# Patient Record
Sex: Female | Born: 1990 | Hispanic: Yes | Marital: Single | Smoking: Never smoker
Health system: Southern US, Community
[De-identification: ages and names within clinical notes are randomized; demographics above are authoritative.]

## PROBLEM LIST (undated history)

## (undated) DIAGNOSIS — R55 Syncope and collapse: Secondary | ICD-10-CM

## (undated) DIAGNOSIS — R000 Tachycardia, unspecified: Secondary | ICD-10-CM

## (undated) DIAGNOSIS — L508 Other urticaria: Secondary | ICD-10-CM

## (undated) DIAGNOSIS — F909 Attention-deficit hyperactivity disorder, unspecified type: Secondary | ICD-10-CM

## (undated) DIAGNOSIS — G90A Postural orthostatic tachycardia syndrome (POTS): Secondary | ICD-10-CM

## (undated) DIAGNOSIS — G932 Benign intracranial hypertension: Secondary | ICD-10-CM

## (undated) DIAGNOSIS — H532 Diplopia: Secondary | ICD-10-CM

## (undated) DIAGNOSIS — G43909 Migraine, unspecified, not intractable, without status migrainosus: Secondary | ICD-10-CM

## (undated) DIAGNOSIS — H4710 Unspecified papilledema: Principal | ICD-10-CM

## (undated) DIAGNOSIS — I498 Other specified cardiac arrhythmias: Secondary | ICD-10-CM

## (undated) DIAGNOSIS — R079 Chest pain, unspecified: Secondary | ICD-10-CM

## (undated) DIAGNOSIS — I951 Orthostatic hypotension: Secondary | ICD-10-CM

## (undated) DIAGNOSIS — F419 Anxiety disorder, unspecified: Secondary | ICD-10-CM

## (undated) HISTORY — DX: Migraine, unspecified, not intractable, without status migrainosus: G43.909

## (undated) HISTORY — DX: Chest pain, unspecified: R07.9

## (undated) HISTORY — DX: Syncope and collapse: R55

## (undated) HISTORY — DX: Anxiety disorder, unspecified: F41.9

## (undated) HISTORY — DX: Benign intracranial hypertension: G93.2

## (undated) HISTORY — DX: Attention-deficit hyperactivity disorder, unspecified type: F90.9

## (undated) HISTORY — PX: WISDOM TOOTH EXTRACTION: SHX21

## (undated) HISTORY — DX: Diplopia: H53.2

## (undated) HISTORY — PX: OTHER SURGICAL HISTORY: SHX169

## (undated) HISTORY — DX: Other urticaria: L50.8

## (undated) HISTORY — DX: Unspecified papilledema: H47.10

---
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
## 2011-03-29 NOTE — Op Note (Signed)
NAMEMADELLINE, ESHBACH               ACCOUNT NO.:  [id number]   MEDICAL RECORD NO.:  [id number]          PATIENT TYPE:  AMB   LOCATION:  SDS                          FACILITY:  [facility]   PHYSICIAN:  Jon Gills, M.D.  DATE OF BIRTH:  10-20-1991   DATE OF PROCEDURE:  10/05/2007  DATE OF DISCHARGE:  10/05/2007                               OPERATIVE REPORT   PREOPERATIVE DIAGNOSIS:  Epigastric abdominal pain.   POSTOPERATIVE DIAGNOSIS:  Epigastric abdominal pain.   OPERATION:  Upper GI endoscopy with biopsy.   SURGEON:  Jon Gills, M.D., pediatric gastroenterologist.   ASSISTANT:  None.   DESCRIPTION OF FINDINGS:  Following informed written consent, the  patient was taken into the operating room and placed under general  anesthesia with continuous cardiopulmonary monitoring.  She remained in  the supine position and the Pentax upper GI endoscope was passed by  mouth and advanced without difficulty.  A competent lower esophageal  sphincter was identified 38 cm from the incisors.  There was no visual  evidence for esophagitis, duodenitis or peptic ulcer disease.  Moderate  erythema and nodularity were seen in the stomach but there was no actual  ulceration.  A solitary gastric biopsy was negative for Helicobacter by  CLO testing.  Multiple biopsies were obtained in the esophagus, stomach  and duodenum and were found to be histologically normal.  The endoscope  was gradually withdrawn, and the patient was awakened and taken to the  recovery room in satisfactory condition.  She will be released later  today to the care of her family.   DESCRIPTION OF TECHNICAL PROCEDURES USED:  Pentax upper GI endoscope  with cold biopsy forceps.   DESCRIPTION OF SPECIMENS REMOVED:  Esophagus times three in formalin.  Gastric times one for CLO testing.  Gastric times three in formalin.  Duodenum times three in formalin.           ______________________________  Jon Gills,  M.D.     JHC/MEDQ  D:  11/13/2007  T:  11/14/2007  Job:  [id number]   cc:   Emeterio Reeve, MD

## 2011-08-23 LAB — DIFFERENTIAL
Eosinophils Absolute: 0.3
Eosinophils Relative: 3
Lymphocytes Relative: 50
Lymphs Abs: 3.9
Monocytes Relative: 4

## 2011-08-23 LAB — CBC
HCT: 39.1
MCV: 89.8
Platelets: 317
RBC: 4.35
WBC: 7.7

## 2011-08-23 LAB — CLOTEST (H. PYLORI), BIOPSY

## 2012-08-29 ENCOUNTER — Encounter: Payer: Self-pay | Admitting: *Deleted

## 2012-08-29 ENCOUNTER — Ambulatory Visit (INDEPENDENT_AMBULATORY_CARE_PROVIDER_SITE_OTHER): Payer: Self-pay | Admitting: Internal Medicine

## 2012-08-29 ENCOUNTER — Encounter: Payer: Self-pay | Admitting: Internal Medicine

## 2012-08-29 VITALS — BP 109/74 | HR 75 | Ht 63.0 in | Wt 148.2 lb

## 2012-08-29 DIAGNOSIS — R55 Syncope and collapse: Secondary | ICD-10-CM

## 2012-08-29 DIAGNOSIS — R079 Chest pain, unspecified: Secondary | ICD-10-CM

## 2012-08-29 HISTORY — DX: Chest pain, unspecified: R07.9

## 2012-08-29 HISTORY — DX: Syncope and collapse: R55

## 2012-08-29 MED ORDER — PROPRANOLOL HCL ER 60 MG PO CP24: 60.0000 mg | ORAL_CAPSULE | Freq: Every day | ORAL | Status: DC

## 2012-08-29 MED ORDER — METOPROLOL TARTRATE 25 MG PO TABS: 25.0000 mg | ORAL_TABLET | Freq: Two times a day (BID) | ORAL | Status: DC

## 2012-08-29 MED ORDER — ATENOLOL 25 MG PO TABS: 25.0000 mg | ORAL_TABLET | Freq: Every day | ORAL | Status: DC

## 2012-08-29 NOTE — Assessment & Plan Note (Signed)
Lightheadedness and chest discomfort with exertion is unusual. We'll undertake an echo to look for the origins of her coronary ostia; if these are not clear we will need more advanced imaging.  I also gave her website information for [facility] place and NDRF

## 2012-08-29 NOTE — Progress Notes (Signed)
ELECTROPHYSIOLOGY CONSULT NOTE  Patient ID: MISTIE ADNEY, MRN: [id number], DOB/AGE: June 21, 1991 21 y.o. Admit date: (Not on file) Date of Consult: 08/29/2012  Primary Physician:2\ No primary provider on file. Primary Cardiologist: Sharlette Dense  Chief Complaint: syncope   HPI KHADIJAH MASTRIANNI is a 21 y.o. female seen at the request of Dr. Paulino Rily because of recurrent syncope.  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 Who is  A triple major.  She has about a 15 month history of syncope although she has a lifelong history of exercise associated lightheadedness particularly notable in recovery. She's had problems with chest discomfort described as a pressure also with exercise.  About a year ago she had her first syncopal episode. This occurred in the context of taking a muscle relaxant to which the syncope was described. That medication was stopped. She's continued to have episodes. There is associated with a stereo typical prodrome of nausea lightheadedness and mild diaphoresis. She does get some eye twitching of her leg. The recovery phase is notable for nausea profound diaphoresis some for power profound fatigue and residual orthostatic intolerance.  She has a shower intolerance heat intolerance Jacuzzi intolerance. Her diet is fluid deplete of salt deplete. She does not notice particularly symptoms being aggravated around the time of her menses.  She had also described variability in blood pressure and heart rate although the numbers that are noted in the record include heart rates up to the low 100 range and blood pressures to the 120 range. She was started on Bystolic 2.5 mg a day and this has been associated with significant improvement.  She also has significant exercise intolerance associated with tachypalpitations. This has been also ameliorated by the aforementioned therapy.  She denies significant psychosocial stress. She does have ADD but she is not on any stimulants at the  current time.   Past Medical History  Diagnosis Date  . ADHD (attention deficit hyperactivity disorder)   . Migraines   . Recurrent urticaria     normal CBC,TSH -?  . Anxiety     induced  . Syncope       Surgical History: No past surgical history on file.   Home Meds: Prior to Admission medications   Medication Sig Start Date End Date Taking? Authorizing Provider  drospirenone-ethinyl estradiol (YAZ,GIANVI,LORYNA) 3-0.02 MG tablet Take 1 tablet by mouth daily.   Yes Historical Provider, MD  nebivolol (BYSTOLIC) 5 MG tablet Take 2.5 mg by mouth daily.   Yes Historical Provider, MD  atenolol (TENORMIN) 25 MG tablet Take 1 tablet (25 mg total) by mouth daily. 08/29/12   Duke Salvia, MD  metoprolol tartrate (LOPRESSOR) 25 MG tablet Take 1 tablet (25 mg total) by mouth 2 (two) times daily. 08/29/12   Duke Salvia, MD  propranolol ER (INDERAL LA) 60 MG 24 hr capsule Take 1 capsule (60 mg total) by mouth daily. 08/29/12   Duke Salvia, MD      Allergies:  Allergies  Allergen Reactions  . Lo Loestrin Fe (Norethin-Eth Estrad-Fe Biphas)     Extreme moodiness  . Mold Extract (Trichophyton)     MOLD -DR Hawley Callas- pt has an allergy to  cockroaches  . Penciclovir     Penicillin causes rash  . Penicillins   . Pollen Extract     Tree pollen/  . Tamiflu (Oseltamivir Phosphate)     Rash / itching 2011  . Vyvanse (Lisdexamfetamine Dimesylate)     anxiety    History  Social History  . Marital Status: Single    Spouse Name: N/A    Number of Children: N/A  . Years of Education: N/A   Occupational History  . Not on file.   Social History Main Topics  . Smoking status: Never Smoker   . Smokeless tobacco: Not on file  . Alcohol Use: Yes     Occassionally  . Drug Use: No  . Sexually Active: Not on file   Other Topics Concern  . Not on file   Social History Narrative   Pt does not smoke.                                                                                                                                       Exercise : Yes walking campus daily at school                                                                                            Education: [employer]     No family history on file.   ROS:  Please see the history of present illness.     All other systems reviewed and negative.    Physical Exam:  Blood pressure 109/74, pulse 75, height 5\' 3"  (1.6 m), weight 148 lb 3.2 oz (67.223 kg). General: Well developed, well nourished female in no acute distress. Head: Normocephalic, atraumatic, sclera non-icteric, no xanthomas, nares are without discharge. Lymph Nodes:  none Back: without scoliosis/kyphosis, no CVA tendersness Neck: Negative for carotid bruits. JVD not elevated. Lungs: Clear bilaterally to auscultation without wheezes, rales, or rhonchi. Breathing is unlabored. Heart: RRR with S1 S2. No  murmur , rubs, or gallops appreciated. Abdomen: Soft, non-tender, non-distended with normoactive bowel sounds. No hepatomegaly. No rebound/guarding. No obvious abdominal masses. Msk:  Strength and tone appear normal for age. Extremities: No clubbing or cyanosis. No  edema.  Distal pedal pulses are 2+ and equal bilaterally. Skin: Warm and Dry Neuro: Alert and oriented X 3. CN III-XII intact Grossly normal sensory and motor function . Psych:  Responds to questions appropriately with a normal affect.      Labs: Cardiac Enzymes No results found for this basename: CKTOTAL:4,CKMB:4,TROPONINI:4 in the last 72 hours CBC Lab Results  Component Value Date   WBC 7.7 10/04/2007   HGB 13.3 10/04/2007   HCT 39.1 10/04/2007   MCV 89.8 10/04/2007   PLT 317 10/04/2007     EKG:  Sinus rhythm at 67 Intervals 13/08/39  Axis LI No evidence of ventricular preexcitation  Assessment and Plan:  Sherryl Manges  The sotalol and a

## 2012-08-29 NOTE — Patient Instructions (Signed)
Your physician wants you to follow-up in: 4 months with Dr. Graciela Husbands.  You will receive a reminder letter in the mail two months in advance. If you don't receive a letter, please call our office to schedule the follow-up appointment.  Your physician has requested that you have an echocardiogram. Echocardiography is a painless test that uses sound waves to create images of your heart. It provides your doctor with information about the size and shape of your heart and how well your heart's chambers and valves are working. This procedure takes approximately one hour. There are no restrictions for this procedure.  Try the following meds and let us know which one you want to take:  Inderal 60mg  daily, Metoprolol Tar. 25mg  BID, Atenolol 25mg  daily.

## 2012-08-29 NOTE — Assessment & Plan Note (Signed)
The patient has symptoms with epiphenomena consistent with a diagnosis of neurally mediated syncope. We spent 50 minutes discussing the physiology and strategies to try to ameliorate symptoms. Specifically we discussed the role of heat avoidance, and salt and water repletion, continued use of beta blockers. We also gave her a handicap pass because of the prolonged distances she has to walk for school.

## 2012-09-06 ENCOUNTER — Ambulatory Visit (HOSPITAL_COMMUNITY): Payer: BC Managed Care – PPO | Attending: Cardiovascular Disease

## 2012-09-06 DIAGNOSIS — I369 Nonrheumatic tricuspid valve disorder, unspecified: Secondary | ICD-10-CM | POA: Insufficient documentation

## 2012-09-06 DIAGNOSIS — I059 Rheumatic mitral valve disease, unspecified: Secondary | ICD-10-CM | POA: Insufficient documentation

## 2012-09-06 DIAGNOSIS — R55 Syncope and collapse: Secondary | ICD-10-CM | POA: Insufficient documentation

## 2012-09-06 NOTE — Progress Notes (Signed)
Echocardiogram performed.  

## 2012-09-12 ENCOUNTER — Telehealth: Payer: Self-pay | Admitting: Internal Medicine

## 2012-09-12 NOTE — Telephone Encounter (Signed)
Or work [phone] , mother calling for echo results

## 2012-09-13 NOTE — Telephone Encounter (Signed)
Gave mother normal results of echo. Please advise of further testing, pt still having dizzy spells with numbness to finger tips and has to lay down. Pt/ mother wants to do further testing.

## 2012-09-13 NOTE — Telephone Encounter (Signed)
F/u   Pt mother calling for f/u on echo results, [phone] wk or [phone] cell

## 2012-10-01 NOTE — Telephone Encounter (Signed)
G  Could you arrange for earlier followup thnaks steve

## 2012-10-02 NOTE — Telephone Encounter (Signed)
Will forward to Dr Odessa Fleming nurse to arrange.

## 2012-10-03 ENCOUNTER — Telehealth: Payer: Self-pay | Admitting: Internal Medicine

## 2012-10-03 NOTE — Telephone Encounter (Signed)
appt moved up.

## 2012-10-03 NOTE — Telephone Encounter (Addendum)
Pt's mother calling upset that it took 6 weeks to get pt in for consult, then had echo and was told would get results in two days, and it took a week, now she says she's been waiting three weeks to get an mri, gesila schedules them and does not have an order, I don't see mention of it in her chart, pt going back to school 10-23-12 and if needs further testing or appts needs done asap, pls call pt's mother

## 2012-10-15 ENCOUNTER — Ambulatory Visit (INDEPENDENT_AMBULATORY_CARE_PROVIDER_SITE_OTHER): Payer: BC Managed Care – PPO | Admitting: Internal Medicine

## 2012-10-15 VITALS — BP 98/60 | HR 57 | Ht 63.0 in | Wt 151.0 lb

## 2012-10-15 DIAGNOSIS — R55 Syncope and collapse: Secondary | ICD-10-CM

## 2012-10-15 DIAGNOSIS — R079 Chest pain, unspecified: Secondary | ICD-10-CM

## 2012-10-15 NOTE — Assessment & Plan Note (Signed)
Coronary ostia are normal. Dr. Jens Som raises the issue as to whether one can infer from the takeoff that the LAD in fact arises from the left ostium. I know in some cases the proximal course of the LAD can be identified and that would suffice.

## 2012-10-15 NOTE — Progress Notes (Signed)
.  kf Patient has no care team.   HPI  Alexandra Schwartz is a 21 y.o. 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  She has had no recurrent syncope. She has had some episodes of profound lightheadedness associated with low blood pressure in the context of severe headaches thought maybe to be migraines.  She is a little bit better on the propranolol compared to the Bystolic. She is not try the other beta blockers as of yet.  Past Medical History  Diagnosis Date  . ADHD (attention deficit hyperactivity disorder)   . Migraines   . Recurrent urticaria     normal CBC,TSH -?  . Anxiety     induced  . Syncope   . Chest pain on exertion 08/29/2012  . 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  Current Outpatient Prescriptions  Medication Sig Dispense Refill  . drospirenone-ethinyl estradiol (YAZ,GIANVI,LORYNA) 3-0.02 MG tablet Take 1 tablet by mouth daily.      . propranolol ER (INDERAL LA) 60 MG 24 hr capsule Take 1 capsule (60 mg total) by mouth daily.  30 capsule  0    Allergies  Allergen Reactions  . Lo Loestrin Fe (Norethin-Eth Estrad-Fe Biphas)     Extreme moodiness  . Mold Extract (Trichophyton)     MOLD -DR [place] Callas- pt has an allergy to  cockroaches  . Penciclovir     Penicillin causes rash  . Penicillins   . Pollen Extract     Tree pollen/  . Tamiflu (Oseltamivir Phosphate)     Rash / itching 2011  . Vyvanse (Lisdexamfetamine Dimesylate)     anxiety    Review of Systems negative except from HPI and PMH  Physical Exam BP 98/60  Pulse 57  Ht 5\' 3"  (1.6 m)  Wt 151 lb (68.493 kg)  BMI 26.75 kg/m2 Well developed and well nourished in no acute distress HENT normal E scleral and icterus clear Neck Supple JVP flat; carotids brisk and full Clear to ausculation  Regular rate and rhythm, no murmurs gallops or rub Soft with active bowel sounds No clubbing cyanosis none Edema Alert and oriented, grossly normal motor  and sensory function Skin Warm and Dry    Assessment and  Plan

## 2012-10-15 NOTE — Patient Instructions (Addendum)
Your physician recommends that you schedule a follow-up appointment in: 8-10 weeks with Dr. Graciela Husbands.

## 2012-10-15 NOTE — Assessment & Plan Note (Signed)
We will continue her on her beta blocker therapy. We have discussed the potential role of Florinef as her blood pressures have been relatively low. The family however reminded me that there've been episodes where her blood pressure has been elevated also. She has increased her salt and water intake. She is going to [place] to see her father for 2 and half weeks. She is advised regarding heat and airplane travel

## 2012-10-31 ENCOUNTER — Telehealth: Payer: Self-pay | Admitting: Internal Medicine

## 2012-10-31 NOTE — Telephone Encounter (Signed)
New problem:    Mother calling very upset regarding her daughter Alexandra Schwartz results. Was told that Dr. Graciela Husbands had ask for repeat reading of test. Would like a call today.

## 2012-10-31 NOTE — Telephone Encounter (Signed)
Mother wants updated results of echo. Will forward to Dr. Graciela Husbands.

## 2012-11-02 NOTE — Telephone Encounter (Signed)
My apologies   coronrary ostia were visualized and were normal thanks

## 2012-11-16 NOTE — Telephone Encounter (Signed)
Left message with normal results

## 2012-11-28 ENCOUNTER — Encounter: Payer: Self-pay | Admitting: Obstetrics and Gynecology

## 2012-11-28 ENCOUNTER — Ambulatory Visit: Payer: BC Managed Care – PPO | Admitting: Obstetrics and Gynecology

## 2012-11-28 VITALS — BP 100/64 | Temp 98.6°F | Wt 154.0 lb

## 2012-11-28 DIAGNOSIS — G43909 Migraine, unspecified, not intractable, without status migrainosus: Secondary | ICD-10-CM | POA: Insufficient documentation

## 2012-11-28 DIAGNOSIS — IMO0001 Reserved for inherently not codable concepts without codable children: Secondary | ICD-10-CM

## 2012-11-28 DIAGNOSIS — F419 Anxiety disorder, unspecified: Secondary | ICD-10-CM

## 2012-11-28 DIAGNOSIS — R102 Pelvic and perineal pain: Secondary | ICD-10-CM

## 2012-11-28 DIAGNOSIS — F909 Attention-deficit hyperactivity disorder, unspecified type: Secondary | ICD-10-CM

## 2012-11-28 LAB — POCT URINALYSIS DIPSTICK
Bilirubin, UA: NEGATIVE
Glucose, UA: NEGATIVE
Ketones, UA: NEGATIVE
Nitrite, UA: NEGATIVE

## 2012-11-28 NOTE — Progress Notes (Signed)
21 YO complains of abdominal/pelvic pain for the past several months. Occurs almost each day off and on, sharp/achy and includes back. No known initiating or alleviating factors.  Has problems starting her urine (doesn't have the sensation or may have to go more frequently) denies urgency, incontinence  Denies changes in bowel movements, dyspareunia or vaginitis symptoms.  States that Ibuprofen 600 mg will help when taken.  O: Abdomen: soft, right lower quadrant tenderness without guarding      Pelvic: EGBUS-wnl, vagina-normal, cervix-no lesions, uterus-normal size but with tenderness at fundus     adnexae-no tenderness or masses   U/A: SG-1.005, pH-6.0, leuk-trace otherwise negative UPT-Negative  A: Pelvic Pain  P: Continue NSAIDs with food as directed-as needed      Pelvic U/S to rule out pelvic masses      Reviewed causes of pelvic pain: urogenital, previous surgery, gastrointestinal and musculoskeletal.      RTO-as scheduled or prn  Jabar Krysiak, PA-C

## 2012-11-28 NOTE — Progress Notes (Signed)
Contraception: OCP (estrogen/progesterone) History of STD:  no history of PID, STD's History of ovarian cyst: no History of fibroids: no History of endometriosis:no Previous ultrasound:no  Urinary symptoms: urinary frequency and urinary hesitancy Gastro-intestinal symptoms:  Constipation: yes     Diarrhea: no     Nausea: no     Vomiting: no     Fever: no Vaginal discharge: no vaginal discharge

## 2012-12-13 ENCOUNTER — Ambulatory Visit: Payer: BC Managed Care – PPO

## 2012-12-13 ENCOUNTER — Ambulatory Visit: Payer: BC Managed Care – PPO | Admitting: Obstetrics and Gynecology

## 2012-12-13 ENCOUNTER — Encounter: Payer: Self-pay | Admitting: Obstetrics and Gynecology

## 2012-12-13 VITALS — BP 90/70 | Temp 99.0°F | Wt 152.0 lb

## 2012-12-13 DIAGNOSIS — R102 Pelvic and perineal pain: Secondary | ICD-10-CM

## 2012-12-13 MED ORDER — LEVONORGESTREL-ETHINYL ESTRAD 0.15-30 MG-MCG PO TABS: 1.0000 | ORAL_TABLET | Freq: Every day | ORAL | Status: DC

## 2012-12-13 NOTE — Progress Notes (Signed)
21 YO seen January 15th for pelvic pain of several month duration returns for pelvic ultrasound.  Patient has noticed since last visit that her pain is more on the right, otherwise no pattern is associated with it.  O: U/S: uterus-5.97 x 3.63 x 2.98 cm with 0.44 endometrium; right ovary-2.77 x 1.47 x 1.58 cm and left ovary-3.29 x 1.96 x 1.52 cm   A: Pelvic Pain  R>L   P: Reviewed causes of pelvic pain: urogenital, previous surgery, gastrointestinal and musculoskeletal with patient and her     mother who accompanies her.  Advised to follow up with Dr. Paulino Rily for non-invasive evaluation of pelvic pain      Advised that laparoscopy to determine endometriosis or scar tissue may be a last resort evaluation if nothing is found otherwise.     Observation is always an option      Patient and her mother want to try another BCP as Lo Loestrin seemed to help with cramps better than Beyaz but it made her     emotional       Levora 1.5/30  #1 1 po qd with 11 refills to begin with next cycle  DAW-brand medically necessary due to patient's reaction to      norethindrone       RTO-1 year or prn  Tomika Eckles, PA-C

## 2013-01-02 ENCOUNTER — Ambulatory Visit: Payer: Self-pay | Admitting: Internal Medicine

## 2013-01-22 ENCOUNTER — Ambulatory Visit: Payer: Self-pay | Admitting: Internal Medicine

## 2013-04-04 ENCOUNTER — Other Ambulatory Visit: Payer: BC Managed Care – PPO

## 2013-04-04 ENCOUNTER — Encounter: Payer: Self-pay | Admitting: Internal Medicine

## 2013-04-04 ENCOUNTER — Encounter: Payer: Self-pay | Admitting: *Deleted

## 2013-04-04 ENCOUNTER — Ambulatory Visit (INDEPENDENT_AMBULATORY_CARE_PROVIDER_SITE_OTHER): Payer: BC Managed Care – PPO | Admitting: Internal Medicine

## 2013-04-04 VITALS — BP 102/76 | HR 92 | Ht 63.0 in | Wt 149.8 lb

## 2013-04-04 DIAGNOSIS — F419 Anxiety disorder, unspecified: Secondary | ICD-10-CM

## 2013-04-04 DIAGNOSIS — F411 Generalized anxiety disorder: Secondary | ICD-10-CM

## 2013-04-04 DIAGNOSIS — R55 Syncope and collapse: Secondary | ICD-10-CM

## 2013-04-04 NOTE — Progress Notes (Signed)
Patient Care Team: Sharon A Wolters, MD as PCP - General (Family Medicine)   HPI  Alexandra Schwartz is a 22 y.o. 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 She does have complaints of lightheadedness with climbing stairs.  She's been treated with beta blockers. There've been some issues with hypertension and so Florinef avoided.  Echo 10/13 demonstrated normal left ventricular function   she is having problems with exercise. She is noted post exercise lightheadedness which is quite significant. She is also, interestingly, no dizziness with climbing and descending stairs. Her volume status is quite replete.  Her mother has noted that when she is symptomatic her blood pressures have been running lower. She is doing salt supplementation via diet   She expresses anxiety about the fact that she is not sure that her illnesses not all psychosomatic  Past Medical History  Diagnosis Date  . ADHD (attention deficit hyperactivity disorder)   . Migraines   . Recurrent urticaria     normal CBC,TSH -?  . Anxiety     induced  . Syncope   . Chest pain on exertion 08/29/2012  . 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 Wisdom    . Wisdom tooth extraction      Current Outpatient Prescriptions  Medication Sig Dispense Refill  . levonorgestrel-ethinyl estradiol (NORDETTE) 0.15-30 MG-MCG tablet Take 1 tablet by mouth daily.  1 Package  11   No current facility-administered medications for this visit.    Allergies  Allergen Reactions  . Lo Loestrin Fe (Norethin-Eth Estrad-Fe Biphas)     Extreme moodiness  . Mold Extract (Trichophyton)     MOLD -DR Sharma- pt has an allergy to  cockroaches  . Penciclovir     Penicillin causes rash  . Penicillins   . Pollen Extract     Tree pollen/  . Tamiflu (Oseltamivir Phosphate)     Rash / itching 2011  . Vyvanse (Lisdexamfetamine Dimesylate)     anxiety    Review of Systems  negative except from HPI and PMH  Physical Exam BP 102/76  Pulse 92  Ht 5' 3" (1.6 m)  Wt 149 lb 12.8 oz (67.949 kg)  BMI 26.54 kg/m2  SpO2 99% Well developed and nourished in no acute distress HENT normal Neck supple with JVP-flat Clear Regular rate and rhythm, no murmurs or gallops Abd-soft with active BS No Clubbing cyanosis edema Skin-warm and dry A & Oriented  Grossly normal sensory and motor function    Assessment and  Plan  

## 2013-04-04 NOTE — Patient Instructions (Signed)
Your physician recommends that you have lab work: 24 hour urine for sodium  Your physician has recommended that you have a tilt table test. This test is sometimes used to help determine the cause of fainting spells. You lie on a table that moves from a lying down to an upright position. The change in position can bring on loss of consciousness. The doctor monitors your symptoms, heart rate, EKG, and blood pressure throughout the test. The doctor also may give you a medicine and then monitor your response to the medicine. This is done in the hospital and usually takes half of a day to complete the procedure. Please see the instruction sheet given to you today for more information.

## 2013-04-04 NOTE — Assessment & Plan Note (Signed)
She continues to have symptoms consistent with dysautonomia with postexercise lightheadedness. Her lightheadedness with climbing stairs is little bit harder for me to understand. Diet fluid replete sounds; we will measure urine sodium excretion. Her blood pressures have been lower since in the event that sodium status is replete we will  Anticipate using ProAmatine.  She would like to pursue tilt table testing for clarification of mechanism. We will arrange that

## 2013-04-15 ENCOUNTER — Telehealth: Payer: Self-pay | Admitting: *Deleted

## 2013-04-15 ENCOUNTER — Encounter: Payer: Self-pay | Admitting: *Deleted

## 2013-04-15 DIAGNOSIS — R55 Syncope and collapse: Secondary | ICD-10-CM

## 2013-04-15 NOTE — Telephone Encounter (Signed)
I called the patient today on her cell # [phone] to schedule her Tilt Table test to be done. This is scheduled for 05/03/13 at 1:00 pm. Written instructions mailed to the patient. I have left a message on her cell # confirming the date and time and that she will need labs on 04/26/13.

## 2013-04-22 ENCOUNTER — Other Ambulatory Visit: Payer: Self-pay | Admitting: Internal Medicine

## 2013-04-25 ENCOUNTER — Other Ambulatory Visit (INDEPENDENT_AMBULATORY_CARE_PROVIDER_SITE_OTHER): Payer: BC Managed Care – PPO

## 2013-04-25 DIAGNOSIS — R55 Syncope and collapse: Secondary | ICD-10-CM

## 2013-04-25 LAB — CBC WITH DIFFERENTIAL/PLATELET
Basophils Relative: 0.5 % (ref 0.0–3.0)
Eosinophils Relative: 1.4 % (ref 0.0–5.0)
HCT: 39.2 % (ref 36.0–46.0)
Hemoglobin: 12.9 g/dL (ref 12.0–15.0)
Lymphs Abs: 3.3 10*3/uL (ref 0.7–4.0)
MCV: 91.6 fl (ref 78.0–100.0)
Monocytes Absolute: 0.3 10*3/uL (ref 0.1–1.0)
Monocytes Relative: 3.6 % (ref 3.0–12.0)
Neutro Abs: 4.4 10*3/uL (ref 1.4–7.7)
RBC: 4.28 Mil/uL (ref 3.87–5.11)
WBC: 8.2 10*3/uL (ref 4.5–10.5)

## 2013-04-25 LAB — BASIC METABOLIC PANEL
Chloride: 108 mEq/L (ref 96–112)
GFR: 128.77 mL/min (ref >60.00)
Potassium: 3.6 mEq/L (ref 3.5–5.1)
Sodium: 139 mEq/L (ref 135–145)

## 2013-04-25 LAB — PROTIME-INR: Prothrombin Time: 10.3 s (ref 10.2–12.4)

## 2013-04-26 ENCOUNTER — Other Ambulatory Visit: Payer: BC Managed Care – PPO

## 2013-04-29 ENCOUNTER — Encounter (HOSPITAL_COMMUNITY): Payer: Self-pay | Admitting: Pharmacy Technician

## 2013-04-30 ENCOUNTER — Other Ambulatory Visit: Payer: Self-pay

## 2013-04-30 ENCOUNTER — Encounter (HOSPITAL_COMMUNITY): Payer: Self-pay | Admitting: Emergency Medicine

## 2013-04-30 ENCOUNTER — Emergency Department (HOSPITAL_COMMUNITY)
Admission: EM | Admit: 2013-04-30 | Discharge: 2013-05-01 | Disposition: A | Discharge status: Home / Self Care | Payer: BC Managed Care – PPO | Attending: Emergency Medicine | Admitting: Emergency Medicine

## 2013-04-30 DIAGNOSIS — Z8679 Personal history of other diseases of the circulatory system: Secondary | ICD-10-CM | POA: Insufficient documentation

## 2013-04-30 DIAGNOSIS — R0789 Other chest pain: Secondary | ICD-10-CM | POA: Insufficient documentation

## 2013-04-30 DIAGNOSIS — E876 Hypokalemia: Secondary | ICD-10-CM

## 2013-04-30 DIAGNOSIS — Z880 Allergy status to penicillin: Secondary | ICD-10-CM | POA: Insufficient documentation

## 2013-04-30 DIAGNOSIS — F411 Generalized anxiety disorder: Secondary | ICD-10-CM | POA: Insufficient documentation

## 2013-04-30 DIAGNOSIS — Z79899 Other long term (current) drug therapy: Secondary | ICD-10-CM | POA: Insufficient documentation

## 2013-04-30 DIAGNOSIS — Z872 Personal history of diseases of the skin and subcutaneous tissue: Secondary | ICD-10-CM | POA: Insufficient documentation

## 2013-04-30 DIAGNOSIS — R110 Nausea: Secondary | ICD-10-CM | POA: Insufficient documentation

## 2013-04-30 DIAGNOSIS — Z8669 Personal history of other diseases of the nervous system and sense organs: Secondary | ICD-10-CM | POA: Insufficient documentation

## 2013-04-30 DIAGNOSIS — R42 Dizziness and giddiness: Secondary | ICD-10-CM | POA: Insufficient documentation

## 2013-04-30 DIAGNOSIS — I498 Other specified cardiac arrhythmias: Secondary | ICD-10-CM | POA: Insufficient documentation

## 2013-04-30 DIAGNOSIS — Z3202 Encounter for pregnancy test, result negative: Secondary | ICD-10-CM | POA: Insufficient documentation

## 2013-04-30 HISTORY — DX: Orthostatic hypotension: I95.1

## 2013-04-30 HISTORY — DX: Tachycardia, unspecified: R00.0

## 2013-04-30 HISTORY — DX: Other specified cardiac arrhythmias: I49.8

## 2013-04-30 HISTORY — DX: Postural orthostatic tachycardia syndrome (POTS): G90.A

## 2013-04-30 LAB — POCT I-STAT, CHEM 8
Chloride: 108 mEq/L (ref 96–112)
Creatinine, Ser: 0.6 mg/dL (ref 0.50–1.10)
Glucose, Bld: 100 mg/dL — ABNORMAL HIGH (ref 70–99)
HCT: 38 % (ref 36.0–46.0)
Hemoglobin: 12.9 g/dL (ref 12.0–15.0)
Potassium: 3.4 mEq/L — ABNORMAL LOW (ref 3.5–5.1)
Sodium: 142 mEq/L (ref 135–145)

## 2013-04-30 MED ORDER — ONDANSETRON 4 MG PO TBDP
4.0000 mg | ORAL_TABLET | Freq: Once | ORAL | Status: AC
  Administered 2013-04-30: 4 mg via ORAL
  Filled 2013-04-30: qty 1

## 2013-04-30 NOTE — ED Notes (Signed)
Pt states she has been diagnosed with POTS and today her blood pressure has been up and down and states she has had pressure in her chest  Pt states she was told if her diastolic blood pressure went above 100 to come to the hospital  Pt states it was earlier  Pressure in triage 123/87

## 2013-04-30 NOTE — ED Notes (Signed)
Earley Favor, NP at bedside.

## 2013-04-30 NOTE — ED Notes (Signed)
Pt is unable to urinate at this time. 

## 2013-05-01 LAB — URINALYSIS, ROUTINE W REFLEX MICROSCOPIC
Bilirubin Urine: NEGATIVE
Hgb urine dipstick: NEGATIVE
Nitrite: NEGATIVE
Specific Gravity, Urine: 1.028 (ref 1.005–1.030)
Urobilinogen, UA: 0.2 mg/dL (ref 0.0–1.0)
pH: 7 (ref 5.0–8.0)

## 2013-05-01 LAB — CBC WITH DIFFERENTIAL/PLATELET
Basophils Relative: 1 % (ref 0–1)
Eosinophils Relative: 1 % (ref 0–5)
HCT: 36.5 % (ref 36.0–46.0)
Hemoglobin: 12.7 g/dL (ref 12.0–15.0)
Lymphs Abs: 4.7 10*3/uL — ABNORMAL HIGH (ref 0.7–4.0)
MCV: 87.3 fL (ref 78.0–100.0)
Monocytes Relative: 5 % (ref 3–12)
Platelets: 363 10*3/uL (ref 150–400)
RBC: 4.18 MIL/uL (ref 3.87–5.11)
WBC: 9.4 10*3/uL (ref 4.0–10.5)

## 2013-05-01 MED ORDER — POTASSIUM CHLORIDE CRYS ER 20 MEQ PO TBCR
20.0000 meq | EXTENDED_RELEASE_TABLET | Freq: Once | ORAL | Status: AC
  Administered 2013-05-01: 20 meq via ORAL
  Filled 2013-05-01: qty 1

## 2013-05-01 MED ORDER — ONDANSETRON 4 MG PO TBDP: 4.0000 mg | ORAL_TABLET | Freq: Once | ORAL | Status: DC

## 2013-05-01 NOTE — ED Provider Notes (Signed)
History     CSN: [id number]  Arrival date & time 04/30/13  2157   First MD Initiated Contact with Patient 04/30/13 2242      Chief Complaint  Patient presents with  . Hypertension  . Chest Pain    (Consider location/radiation/quality/duration/timing/severity/associated sxs/prior treatment) HPI Comments: Patient with a history of POTS syndrome has had labile blood pressures.  Today, and she's been more symptomatic than normal.  She has an appointment on Friday with Dr. Graciela Husbands for a tilt table test  Patient is a 22 y.o. female presenting with hypertension and chest pain. The history is provided by the patient.  Hypertension This is a recurrent problem. The current episode started today. The problem occurs intermittently. The problem has been gradually worsening. Associated symptoms include chest pain and nausea. Pertinent negatives include no chills, congestion, fever or weakness. The symptoms are aggravated by exertion. She has tried rest for the symptoms. The treatment provided mild relief.  Chest Pain Associated symptoms: dizziness and nausea   Associated symptoms: no fever and no weakness     Past Medical History  Diagnosis Date  . ADHD (attention deficit hyperactivity disorder)   . Migraines   . Recurrent urticaria     normal CBC,TSH -?  . Anxiety     induced  . Syncope   . Chest pain on exertion 08/29/2012  . 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 POTS (postural orthostatic tachycardia syndrome)     Past Surgical History  Procedure Laterality Date  . Wisdom    . Wisdom tooth extraction      Family History  Problem Relation Age of Onset  . Cancer Paternal Grandmother     uterine  . Hypertension Mother   . Cancer Other     brain  . Cancer Other     throat  . Stroke Maternal Uncle     History  Substance Use Topics  . Smoking status: Never Smoker   . Smokeless tobacco: Never Used  . Alcohol Use: Yes     Comment: Occasionally    OB History   Grav Para  Term Preterm Abortions TAB SAB Ect Mult Living   0               Review of Systems  Constitutional: Negative for fever and chills.  HENT: Negative for hearing loss, congestion and rhinorrhea.   Cardiovascular: Positive for chest pain.  Gastrointestinal: Positive for nausea.  Genitourinary: Negative for dysuria.  Neurological: Positive for dizziness and light-headedness. Negative for weakness.  All other systems reviewed and are negative.    Allergies  Vyvanse; Lo loestrin fe; Mold extract; Penciclovir; Penicillins; and Tamiflu  Home Medications   Current Outpatient Rx  Name  Route  Sig  Dispense  Refill  . levonorgestrel-ethinyl estradiol (PORTIA-28) 0.15-30 MG-MCG tablet   Oral   Take 1 tablet by mouth daily.         . ondansetron (ZOFRAN-ODT) 4 MG disintegrating tablet   Oral   Take 1 tablet (4 mg total) by mouth once.   20 tablet   0     BP 114/72  Pulse 76  Temp(Src) 98.2 F (36.8 C) (Oral)  Resp 17  Ht 5\' 2"  (1.575 m)  Wt 148 lb (67.132 kg)  BMI 27.06 kg/m2  SpO2 99%  LMP 03/30/2013  Physical Exam  Nursing note and vitals reviewed. Constitutional: She is oriented to person, place, and time. She appears well-developed and well-nourished.  HENT:  Head: Normocephalic and atraumatic.  Eyes:  Pupils are equal, round, and reactive to light.  Neck: Normal range of motion.  Cardiovascular: Normal rate, regular rhythm, normal heart sounds and intact distal pulses.  Exam reveals no friction rub.   No murmur heard. Pulmonary/Chest: Effort normal and breath sounds normal.  Abdominal: Soft. There is no tenderness.  Musculoskeletal: Normal range of motion. She exhibits no edema and no tenderness.  Neurological: She is alert and oriented to person, place, and time.  Skin: Skin is warm and dry. No rash noted. No pallor.    ED Course  Procedures (including critical care time)  Labs Reviewed  CBC WITH DIFFERENTIAL - Abnormal; Notable for the following:     Lymphocytes Relative 50 (*)    Lymphs Abs 4.7 (*)    All other components within normal limits  URINALYSIS, ROUTINE W REFLEX MICROSCOPIC - Abnormal; Notable for the following:    APPearance HAZY (*)    All other components within normal limits  POCT I-STAT, CHEM 8 - Abnormal; Notable for the following:    Potassium 3.4 (*)    Glucose, Bld 100 (*)    All other components within normal limits  POCT PREGNANCY, URINE   No results found.   1. POTS (postural orthostatic tachycardia syndrome)   2. Nausea   3. Hypokalemia       MDM   Patient has not nausea, controlled to discharge him with prescription for Zofran instructions to followup with Dr. Graciela Husbands as scheduled on Friday        Arman Filter, NP 05/01/13 2111

## 2013-05-03 ENCOUNTER — Ambulatory Visit (HOSPITAL_COMMUNITY)
Admission: RE | Admit: 2013-05-03 | Discharge: 2013-05-03 | Disposition: A | Discharge status: Home / Self Care | Payer: BC Managed Care – PPO | Source: Ambulatory Visit | Attending: Internal Medicine | Admitting: Internal Medicine

## 2013-05-03 ENCOUNTER — Encounter (HOSPITAL_COMMUNITY)
Admission: RE | Disposition: A | Discharge status: Home / Self Care | Payer: Self-pay | Source: Ambulatory Visit | Attending: Internal Medicine

## 2013-05-03 DIAGNOSIS — F411 Generalized anxiety disorder: Secondary | ICD-10-CM | POA: Insufficient documentation

## 2013-05-03 DIAGNOSIS — Z880 Allergy status to penicillin: Secondary | ICD-10-CM | POA: Insufficient documentation

## 2013-05-03 DIAGNOSIS — L508 Other urticaria: Secondary | ICD-10-CM | POA: Insufficient documentation

## 2013-05-03 DIAGNOSIS — R55 Syncope and collapse: Secondary | ICD-10-CM | POA: Insufficient documentation

## 2013-05-03 DIAGNOSIS — Z888 Allergy status to other drugs, medicaments and biological substances status: Secondary | ICD-10-CM | POA: Insufficient documentation

## 2013-05-03 DIAGNOSIS — R0789 Other chest pain: Secondary | ICD-10-CM | POA: Insufficient documentation

## 2013-05-03 DIAGNOSIS — Z9109 Other allergy status, other than to drugs and biological substances: Secondary | ICD-10-CM | POA: Insufficient documentation

## 2013-05-03 DIAGNOSIS — G43909 Migraine, unspecified, not intractable, without status migrainosus: Secondary | ICD-10-CM | POA: Insufficient documentation

## 2013-05-03 DIAGNOSIS — F909 Attention-deficit hyperactivity disorder, unspecified type: Secondary | ICD-10-CM | POA: Insufficient documentation

## 2013-05-03 DIAGNOSIS — I10 Essential (primary) hypertension: Secondary | ICD-10-CM | POA: Insufficient documentation

## 2013-05-03 HISTORY — PX: TILT TABLE STUDY: SHX5493

## 2013-05-03 SURGERY — TILT TABLE STUDY
Anesthesia: LOCAL

## 2013-05-03 MED ORDER — SODIUM CHLORIDE 0.9 % IV SOLN
Freq: Once | INTRAVENOUS | Status: AC
  Administered 2013-05-03: 12:00:00 via INTRAVENOUS

## 2013-05-03 NOTE — Interval H&P Note (Signed)
History and Physical Interval Note:  05/03/2013 1:57 PM  Alexandra Schwartz  has presented today for surgery, with the diagnosis of Syncope  The various methods of treatment have been discussed with the patient and family. After consideration of risks, benefits and other options for treatment, the patient has consented to  Procedure(s): TILT TABLE STUDY (N/A) as a surgical intervention .  The patient's history has been reviewed, patient examined, no change in status, stable for surgery.  I have reviewed the patient's chart and labs.  Questions were answered to the patient's satisfaction.     Sherryl Manges

## 2013-05-03 NOTE — CV Procedure (Signed)
tILT TABLE TEST   EQUILIBRATED INT HE SUPINE POSITION WITH bp 75-80 AND bp 110-115  tilt UPRIGHT TO 70 DEGREES FOR 20 MIN FOLLOWED BY ntg 0.4 MG SL    NO SIGNIFICANT CHANGE IN HEMODYNAMICS    NEG TILT TEST

## 2013-05-03 NOTE — ED Provider Notes (Signed)
Medical screening examination/treatment/procedure(s) were performed by non-physician practitioner and as supervising physician I was immediately available for consultation/collaboration.  Jeorgia Helming, MD 05/03/13 0752 

## 2013-05-03 NOTE — H&P (View-Only) (Signed)
Patient Care Team: Emeterio Reeve, MD as PCP - General (Family Medicine)   HPI  Alexandra Schwartz is a 22 y.o. 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 She does have complaints of lightheadedness with climbing stairs.  She's been treated with beta blockers. There've been some issues with hypertension and so Florinef avoided.  Echo 10/13 demonstrated normal left ventricular function   she is having problems with exercise. She is noted post exercise lightheadedness which is quite significant. She is also, interestingly, no dizziness with climbing and descending stairs. Her volume status is quite replete.  Her mother has noted that when she is symptomatic her blood pressures have been running lower. She is doing salt supplementation via diet   She expresses anxiety about the fact that she is not sure that her illnesses not all psychosomatic  Past Medical History  Diagnosis Date  . ADHD (attention deficit hyperactivity disorder)   . Migraines   . Recurrent urticaria     normal CBC,TSH -?  . Anxiety     induced  . Syncope   . Chest pain on exertion 08/29/2012  . 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 Wisdom    . Wisdom tooth extraction      Current Outpatient Prescriptions  Medication Sig Dispense Refill  . levonorgestrel-ethinyl estradiol (NORDETTE) 0.15-30 MG-MCG tablet Take 1 tablet by mouth daily.  1 Package  11   No current facility-administered medications for this visit.    Allergies  Allergen Reactions  . Lo Loestrin Fe (Norethin-Eth Estrad-Fe Biphas)     Extreme moodiness  . Mold Extract (Trichophyton)     MOLD -DR Hoke Callas- pt has an allergy to  cockroaches  . Penciclovir     Penicillin causes rash  . Penicillins   . Pollen Extract     Tree pollen/  . Tamiflu (Oseltamivir Phosphate)     Rash / itching 2011  . Vyvanse (Lisdexamfetamine Dimesylate)     anxiety    Review of Systems  negative except from HPI and PMH  Physical Exam BP 102/76  Pulse 92  Ht 5\' 3"  (1.6 m)  Wt 149 lb 12.8 oz (67.949 kg)  BMI 26.54 kg/m2  SpO2 99% Well developed and nourished in no acute distress HENT normal Neck supple with JVP-flat Clear Regular rate and rhythm, no murmurs or gallops Abd-soft with active BS No Clubbing cyanosis edema Skin-warm and dry A & Oriented  Grossly normal sensory and motor function    Assessment and  Plan

## 2013-07-02 ENCOUNTER — Encounter: Payer: BC Managed Care – PPO | Admitting: Internal Medicine

## 2013-07-16 ENCOUNTER — Other Ambulatory Visit: Payer: Self-pay | Admitting: Ophthalmology

## 2013-07-16 DIAGNOSIS — H4710 Unspecified papilledema: Secondary | ICD-10-CM

## 2013-07-17 ENCOUNTER — Ambulatory Visit
Admission: RE | Admit: 2013-07-17 | Discharge: 2013-07-17 | Disposition: A | Discharge status: Home / Self Care | Payer: BC Managed Care – PPO | Source: Ambulatory Visit | Attending: Ophthalmology | Admitting: Ophthalmology

## 2013-07-17 DIAGNOSIS — H4710 Unspecified papilledema: Secondary | ICD-10-CM

## 2013-07-17 MED ORDER — GADOBENATE DIMEGLUMINE 529 MG/ML IV SOLN
15.0000 mL | Freq: Once | INTRAVENOUS | Status: AC | PRN
  Administered 2013-07-17: 15 mL via INTRAVENOUS

## 2013-07-19 ENCOUNTER — Ambulatory Visit (INDEPENDENT_AMBULATORY_CARE_PROVIDER_SITE_OTHER): Payer: BC Managed Care – PPO | Admitting: Neurology

## 2013-07-19 ENCOUNTER — Encounter: Payer: Self-pay | Admitting: Neurology

## 2013-07-19 VITALS — BP 114/80 | HR 79 | Ht 63.0 in | Wt 157.5 lb

## 2013-07-19 DIAGNOSIS — H532 Diplopia: Secondary | ICD-10-CM

## 2013-07-19 DIAGNOSIS — H4710 Unspecified papilledema: Secondary | ICD-10-CM

## 2013-07-19 HISTORY — DX: Diplopia: H53.2

## 2013-07-19 HISTORY — DX: Unspecified papilledema: H47.10

## 2013-07-19 MED ORDER — TRAMADOL HCL 50 MG PO TABS: 50.0000 mg | ORAL_TABLET | Freq: Four times a day (QID) | ORAL | Status: DC | PRN

## 2013-07-19 NOTE — Progress Notes (Signed)
Reason for visit: Papilledema  Alexandra Schwartz is a 22 y.o. female  History of present illness:   Alexandra Schwartz is a 22 year old right-handed Hispanic female who presents with headache that began 2 weeks ago. The patient indicates that the headache is on the left side of the head from the front to the occipital area, associated with left-sided neck stiffness. Approximately 3 days ago, the patient developed double vision in the horizontal plane associated with mild ptosis of the left eye. The patient was seen by an ophthalmologist, and the patient was felt to have early papilledema bilaterally, slightly worse on the left. The patient reports blurred vision, again worse on the left side. The patient denies any numbness or weakness of the extremities. The patient has not had any confusion. The patient is on birth control pills, but she has been on this current medication for about one year. The patient denies any balance issues or problems controlling the bowels or the bladder. The patient has been evaluated over the last year for episodes of syncope, but the patient was not having headaches around that time. MRI evaluation of the brain has been unremarkable. The patient is sent to this office for an evaluation.  Past Medical History  Diagnosis Date  . ADHD (attention deficit hyperactivity disorder)   . Migraines   . Recurrent urticaria     normal CBC,TSH -?  . Anxiety     induced  . Syncope   . Chest pain on exertion 08/29/2012  . 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 POTS (postural orthostatic tachycardia syndrome)   . 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 Diplopia 07/19/2013    Past Surgical History  Procedure Laterality Date  . Wisdom    . Wisdom tooth extraction      Family History  Problem Relation Age of Onset  . Cancer Paternal Grandmother     uterine  . Hypertension Mother   . Cancer Other     brain  . Cancer Other     throat  . Stroke Maternal Uncle     Social history:  reports  that she has never smoked. She has never used smokeless tobacco. She reports that  drinks alcohol. She reports that she does not use illicit drugs.  Medications:  Current Outpatient Prescriptions on File Prior to Visit  Medication Sig Dispense Refill  . levonorgestrel-ethinyl estradiol (PORTIA-28) 0.15-30 MG-MCG tablet Take 1 tablet by mouth daily.      . ondansetron (ZOFRAN-ODT) 4 MG disintegrating tablet Take 1 tablet (4 mg total) by mouth once.  20 tablet  0   No current facility-administered medications on file prior to visit.      Allergies  Allergen Reactions  . Vyvanse [Lisdexamfetamine Dimesylate] Palpitations  . Lo Loestrin Fe [Norethin-Eth Estrad-Fe Biphas] Other (See Comments)    Extreme moodiness  . Mold Extract [Trichophyton] Other (See Comments)    MOLD-DR Sharma-pt has an allergy to cockroaches  . Penciclovir Itching, Rash and Other (See Comments)    All penicillins causes rash and itching  . Penicillins Itching, Rash and Other (See Comments)    All penicillins causes rash and itching  . Tamiflu [Oseltamivir Phosphate] Itching and Rash    ROS:  Out of a complete 14 system review of symptoms, the patient complains only of the following symptoms, and all other reviewed systems are negative.  Fatigue Reading in the ears Blurred vision, double vision, eye pain Constipation Feeling hot Achy muscles Allergies Headaches, dizziness History of syncope  Blood pressure  114/80, pulse 79, height 5\' 3"  (1.6 m), weight 157 lb 8 oz (71.442 kg).  Physical Exam  General: The patient is alert and cooperative at the time of the examination. The patient is minimally obese.  Head: Pupils are equal, round, and reactive to light. Discs are slightly blurred bilaterally.  Neck: The neck is supple, no carotid bruits are noted.  Respiratory: The respiratory examination is clear.  Cardiovascular: The cardiovascular examination reveals a regular rate and rhythm, no obvious  murmurs or rubs are noted.  Skin: Extremities are without significant edema.  Neurologic Exam  Mental status:  Cranial nerves: Facial symmetry is not present. There is ptosis of the left eye. There is good sensation of the face to pinprick and soft touch bilaterally. The strength of the facial muscles and the muscles to head turning and shoulder shrug are normal bilaterally. Speech is well enunciated, no aphasia or dysarthria is noted. Extraocular movements are full. Visual fields are full. Cover test is positive in the horizontal plane.  Motor: The motor testing reveals 5 over 5 strength of all 4 extremities. Good symmetric motor tone is noted throughout.  Sensory: Sensory testing is intact to pinprick, soft touch, vibration sensation, and position sense on all 4 extremities. No evidence of extinction is noted.  Coordination: Cerebellar testing reveals good finger-nose-finger and heel-to-shin bilaterally.  Gait and station: Gait is normal. Tandem gait is normal. Romberg is negative. No drift is seen.  Reflexes: Deep tendon reflexes are symmetric and normal bilaterally. Toes are downgoing bilaterally.   Assessment/Plan:  1. Papilledema  2. Diplopia  3. Unilateral headache, left-sided  4. History syncope  The patient is having a fairly rapid onset of symptoms including daily of left-sided headaches, double vision, ptosis of the left eye, and blurring of vision of the left eye. The onset of symptoms with a unilateral headache, and that the patient is not significantly overweight is somewhat unusual. The patient is on birth control pills which may be an etiology for pseudotumor cerebri. Pseudotumor headaches are usually generalized in nature. The patient will be set up for MRV of the head to exclude a sinus thrombosis, given the fact that the symptoms are unilateral and the patient is on birth control pills. MRA will be done to exclude a carotid dissection, once again because of the  unilateral symptoms and the ptosis of the left eye. The patient will undergo lumbar puncture and spinal fluid analysis. If the lumbar puncture reveals an elevated spinal fluid pressure, with normal spinal fluid, medication such as Diamox will be given. The patient will followup in 3 months. If the spinal fluid pressure is normal, further blood work may need to be done. Ultram was given for headache.  Marlan Palau MD 07/20/2013 3:10 PM  [facility] [address] [place], [place] [address]  Phone [phone] Fax [phone]

## 2013-07-21 ENCOUNTER — Encounter: Payer: Self-pay | Admitting: Neurology

## 2013-07-22 ENCOUNTER — Telehealth: Payer: Self-pay | Admitting: *Deleted

## 2013-07-22 ENCOUNTER — Encounter: Payer: Self-pay | Admitting: Neurology

## 2013-07-22 ENCOUNTER — Telehealth: Payer: Self-pay | Admitting: Neurology

## 2013-07-22 DIAGNOSIS — R51 Headache: Secondary | ICD-10-CM

## 2013-07-22 MED ORDER — TOPIRAMATE 25 MG PO TABS: ORAL_TABLET | ORAL | Status: DC

## 2013-07-22 NOTE — Telephone Encounter (Signed)
I called the patient and I spoke with the mother. The patient is having ongoing left-sided headaches, tingling on the left side the head. The patient is having blurring of vision of the left eye, double vision. I will go ahead and start Topamax at this time. The patient will have MRV and MRA of the head tonight. Lumbar puncture has not yet been set up.

## 2013-07-23 ENCOUNTER — Other Ambulatory Visit: Payer: Self-pay | Admitting: Neurology

## 2013-07-23 ENCOUNTER — Telehealth: Payer: Self-pay | Admitting: Neurology

## 2013-07-23 DIAGNOSIS — H4710 Unspecified papilledema: Secondary | ICD-10-CM

## 2013-07-23 DIAGNOSIS — H532 Diplopia: Secondary | ICD-10-CM

## 2013-07-23 NOTE — Telephone Encounter (Signed)
Mother in the office to p/u letter.   I was able to make appt for pt on Friday 07-26-13 at 0800, be there 0745 at [facility] [address].  Spoke to [place], [phone].  Pt will need driver, and be on 14NWGN bedrest after LP done.  Mother to call and confirm appt and then ask questions re: to eating, etc.   MRV/MRA to be read in Dr. Richrd Humbles office.

## 2013-07-23 NOTE — Telephone Encounter (Signed)
Mother calling about results of MRA/MRV.  They are to be read today by Dr. Marjory Lies.  They are in his office.

## 2013-07-24 ENCOUNTER — Telehealth: Payer: Self-pay | Admitting: *Deleted

## 2013-07-24 ENCOUNTER — Other Ambulatory Visit: Payer: Self-pay | Admitting: Diagnostic Neuroimaging

## 2013-07-24 DIAGNOSIS — H4710 Unspecified papilledema: Secondary | ICD-10-CM

## 2013-07-24 DIAGNOSIS — H532 Diplopia: Secondary | ICD-10-CM

## 2013-07-24 NOTE — Telephone Encounter (Signed)
I have already called with the results of the MRA and MRV, please refer to the other node.

## 2013-07-24 NOTE — Telephone Encounter (Signed)
Pt and mother came into the office.   She said that pain and vision continues with L side but also now to R side.  (headache increase, vision decrease) worse pain when up and moving around.  Taking the ultram every 6 hours.   Lying down pt better.  After consulting Dr. Anne Hahn, I asked pt about if she was taking the topamax and she was not aware that this was called in.   I told her that she can get this at pharmacy and start with one tablet (25mg )  po qhs for one week then advance as directed.  She is to keep the LP appt on Friday as scheduled.  MRA/MRV preliminary looks good. Dr. Anne Hahn will get report and call.

## 2013-07-24 NOTE — Telephone Encounter (Signed)
I called patient. The MRA and MRV were unremarkable. Lumbar puncture is to be done on Friday. I will call her with results.

## 2013-07-26 ENCOUNTER — Ambulatory Visit
Admission: RE | Admit: 2013-07-26 | Discharge: 2013-07-26 | Disposition: A | Discharge status: Home / Self Care | Payer: BC Managed Care – PPO | Source: Ambulatory Visit | Attending: Neurology | Admitting: Neurology

## 2013-07-26 ENCOUNTER — Telehealth: Payer: Self-pay | Admitting: Neurology

## 2013-07-26 ENCOUNTER — Other Ambulatory Visit: Payer: Self-pay | Admitting: Neurology

## 2013-07-26 VITALS — BP 94/61 | HR 67

## 2013-07-26 DIAGNOSIS — G43909 Migraine, unspecified, not intractable, without status migrainosus: Secondary | ICD-10-CM

## 2013-07-26 DIAGNOSIS — R079 Chest pain, unspecified: Secondary | ICD-10-CM

## 2013-07-26 DIAGNOSIS — R55 Syncope and collapse: Secondary | ICD-10-CM

## 2013-07-26 DIAGNOSIS — F909 Attention-deficit hyperactivity disorder, unspecified type: Secondary | ICD-10-CM

## 2013-07-26 DIAGNOSIS — H532 Diplopia: Secondary | ICD-10-CM

## 2013-07-26 DIAGNOSIS — H4710 Unspecified papilledema: Secondary | ICD-10-CM

## 2013-07-26 DIAGNOSIS — F419 Anxiety disorder, unspecified: Secondary | ICD-10-CM

## 2013-07-26 LAB — CSF CELL COUNT WITH DIFFERENTIAL: RBC Count, CSF: 0 cu mm

## 2013-07-26 LAB — CRYPTOCOCCAL ANTIGEN, CSF: Crypto Ag: NEGATIVE

## 2013-07-26 NOTE — Telephone Encounter (Signed)
PATIENT NOTIFIED THAT LETTER WRITTEN BY DR  Anne Hahn READY FOR P/U

## 2013-07-26 NOTE — Progress Notes (Signed)
One tiger-topped tube of blood drawn for labs per orders from right [facility] space; site unremarkable.  jkl

## 2013-07-26 NOTE — Telephone Encounter (Signed)
I called patient and I talked with her mother. The patient is already on Topamax which is fine. We will go up on the dose over the next several weeks, and repeat adjust the dose depending upon her needs. The spinal fluid so far looks good, no inflammatory cells, normal glucose and protein.

## 2013-07-26 NOTE — Telephone Encounter (Signed)
I Called the patient. The opening pressure on the lumbar puncture was 39 cm. The pressure is significantly elevated, the patient will need to start Topamax. Spinal fluid results are still pending.

## 2013-07-29 ENCOUNTER — Telehealth: Payer: Self-pay | Admitting: Radiology

## 2013-07-29 ENCOUNTER — Telehealth: Payer: Self-pay | Admitting: Neurology

## 2013-07-29 LAB — VDRL, CSF

## 2013-07-29 LAB — LYME AB/WESTERN BLOT REFLEX: B burgdorferi Ab IgG+IgM: 0.56 {ISR}

## 2013-07-29 MED ORDER — BUTALBITAL-APAP-CAFFEINE 50-325-40 MG PO TABS: 1.0000 | ORAL_TABLET | Freq: Four times a day (QID) | ORAL | Status: DC | PRN

## 2013-07-29 NOTE — Telephone Encounter (Signed)
Spoke with pt's Mom after talking to [place] at Dr. Clarisa Kindred office. Dr Marjory Lies would like to wait until Wednesday for blood patch. Pt is to hydrate, drink caffine and office will call in meds to help with headache. Mom states she is okay with this plan and will call Wednesday if no better.

## 2013-07-29 NOTE — Telephone Encounter (Signed)
Debbie from [facility] called back and mother will p/u medication for pt at pharmacy.   Faxed over the [employer].  DONE.  Pt will also continue fluids and caffiene for another 2 days.  This which she has been doing already.

## 2013-07-29 NOTE — Telephone Encounter (Signed)
Pt's mom called, pt had LP on Friday. Pt has been in bed since last Friday and is still having a headache and can not be up with out increase in symptoms. Pt has a severe positional headache. Will call Dr. Anne Hahn to see if blood patch is appropriate for this pt.

## 2013-07-29 NOTE — Telephone Encounter (Signed)
Recommend hydration, caffeine and fioricet as needed. Advise to wait 5-7 days before considering blood patch.  Suanne Marker, MD 07/29/2013, 2:18 PM Certified in Neurology, Neurophysiology and Neuroimaging  [facility] [address] [place], [place] [address] [phone]

## 2013-07-29 NOTE — Telephone Encounter (Signed)
WID:  [facility] called and patient had an LP on Friday, and has been having a headache since then.  Severe positional headache.  Can patient have a blood patch.

## 2013-07-30 LAB — BORRELIA SPECIES DNA, FLUID, PCR: B. burgdorferi DNA: NOT DETECTED

## 2013-07-31 LAB — ANGIOTENSIN CONVERTING ENZYME, CSF: ACE, CSF: 4 U/L (ref <=15)

## 2013-07-31 NOTE — Progress Notes (Signed)
Patient's mom called to report patient with ongoing headache and pressure behind her eyes since LP done here 07/26/13.  She had called [name] on 07/29/13, then spoke with Dr. Clarisa Kindred office regarding patient's symptoms.  They had prescribed her some medication, apparently, and encouraged increased fluid intake and continued strict bedrest with instructions to call today if patient still felt bad.  And she feels "terrible."  Message left for Lupita Leash at [facility] regarding [place] and whether or not they want blood patch.  jkl

## 2013-08-01 ENCOUNTER — Telehealth: Payer: Self-pay | Admitting: Neurology

## 2013-08-01 ENCOUNTER — Telehealth: Payer: Self-pay | Admitting: Radiology

## 2013-08-01 DIAGNOSIS — G971 Other reaction to spinal and lumbar puncture: Secondary | ICD-10-CM | POA: Insufficient documentation

## 2013-08-01 NOTE — Telephone Encounter (Signed)
Please call patient, CSF study was normal.

## 2013-08-01 NOTE — Telephone Encounter (Signed)
I have called her, she had LP open pressure 39 cm H20, in Sep 12th, she developed positional headache afterwards, she has missed 3 weeks of school,  Her headache overall is better but she could not set up for 10 minutes without getting a pounding headache, she has tried bed resting, caffeine, hydration, Fioricet as needed.  We decided to proceed with blood patch. Order was placed,

## 2013-08-01 NOTE — Telephone Encounter (Signed)
Appointment for blood patch is scheduled for 11:30 tomorrow morning. Pt to arrive at 11:15 with driver . She is aware.

## 2013-08-01 NOTE — Telephone Encounter (Signed)
Spoke to [facility] and relayed Blood patch order has been entered, they will contact patient.

## 2013-08-02 ENCOUNTER — Ambulatory Visit
Admission: RE | Admit: 2013-08-02 | Discharge: 2013-08-02 | Disposition: A | Discharge status: Home / Self Care | Payer: BC Managed Care – PPO | Source: Ambulatory Visit | Attending: Neurology | Admitting: Neurology

## 2013-08-02 ENCOUNTER — Other Ambulatory Visit: Payer: Self-pay | Admitting: Neurology

## 2013-08-02 VITALS — BP 114/68 | HR 73

## 2013-08-02 MED ORDER — IOHEXOL 180 MG/ML  SOLN
1.0000 mL | Freq: Once | INTRAMUSCULAR | Status: AC | PRN
  Administered 2013-08-02: 1 mL via EPIDURAL

## 2013-08-02 MED ORDER — HYDROCODONE-ACETAMINOPHEN 5-325 MG PO TABS
1.0000 | ORAL_TABLET | Freq: Once | ORAL | Status: AC
  Administered 2013-08-02: 1 via ORAL

## 2013-08-02 NOTE — Progress Notes (Signed)
Returned to nursing area post blood patch. Mom at bedside. They understand discharge instructions.

## 2013-08-02 NOTE — Progress Notes (Signed)
12cc blood obtained by Victory Dakin, RN from patient's left wrist for procedure; site unremarkable.  jkl

## 2013-08-02 NOTE — Progress Notes (Addendum)
Dr. Alfredo Batty in to speak with patient and her mother about procedure, risks, benefits.  Questions answered, consent signed.  Attempted x 2 (right and left AC spaces) to draw blood for procedure without success and with patient exhibiting signs of feeling significant discomfort; sites unremarkable.  jkl

## 2013-08-05 ENCOUNTER — Other Ambulatory Visit: Payer: Self-pay | Admitting: Neurology

## 2013-08-05 ENCOUNTER — Telehealth: Payer: Self-pay | Admitting: Neurology

## 2013-08-05 MED ORDER — DICLOFENAC POTASSIUM(MIGRAINE) 50 MG PO PACK: 50.0000 mg | PACK | Freq: Once | ORAL | Status: DC | PRN

## 2013-08-05 NOTE — Telephone Encounter (Signed)
Called patient who was suffering from severe headache, not responding to Tramadol. Will order Cambia 50mg  packet, instructed to take with food.

## 2013-08-09 ENCOUNTER — Encounter: Payer: Self-pay | Admitting: Neurology

## 2013-08-09 ENCOUNTER — Ambulatory Visit (INDEPENDENT_AMBULATORY_CARE_PROVIDER_SITE_OTHER): Payer: BC Managed Care – PPO | Admitting: Neurology

## 2013-08-09 VITALS — BP 104/70 | HR 84 | Temp 98.1°F | Resp 20 | Ht 63.0 in | Wt 154.0 lb

## 2013-08-09 DIAGNOSIS — G932 Benign intracranial hypertension: Secondary | ICD-10-CM

## 2013-08-09 MED ORDER — TOPIRAMATE 100 MG PO TABS: 100.0000 mg | ORAL_TABLET | Freq: Two times a day (BID) | ORAL | Status: DC

## 2013-08-09 MED ORDER — TOPIRAMATE 25 MG PO CPSP: ORAL_CAPSULE | ORAL | Status: DC

## 2013-08-09 NOTE — Progress Notes (Signed)
[facility] Neurology Division Clinic Note - Initial Visit   Date: 08/09/2013   Alexandra Schwartz MRN: [id number] DOB: 1991-09-17   Dear Dr Maple Hudson:  Thank you for your kind referral of Alexandra Schwartz for consultation of pseudotumor cerebri. Although her history is well known to you, please allow Alexandra Schwartz to reiterate it for the purpose of our medical record. The patient was accompanied to the clinic by her mother.   History of Present Illness: Alexandra Schwartz is a 22 y.o. year-old right-handed Hispanic female with history of ADHD, migraines, syncope, anxiety, and recently diagnosed pseudotumor cerebri presenting for evaluation of headaches and double vision.    Since early August, she started having migraines intermittently and initially thought it was stress-related because of starting school, but on 9/2 she woke up with double vision, with images side-by-side, and severe blurry vision.  She saw her PCP who recommended seeing a opthalmologist who noticed that she had early papilledema bilaterally, worse on the left. Headache was described as throbbing, as if head her head was tight and it was going to explode.  She had severe photophobia.  He then referred her to a neurologist and ordered an MRI brain.  MRI brain was unremarkable.  She was seen by Dr. Anne Hahn at [facility] on 9/5 for papilledema and two-week history of left-sided headaches.  He noted left ptosis and ordered MRV and MRA which were essentially unremarkable.  She underwent lumbar puncture on 9/12 which showed an opening pressure of 39cm.  CSF analysis was unremarkable.  Immediately, following the LP (27cc removed, CP 9cm), the pressure headache and vision completely resolved.  However, within 10 minutes of having the LP completed, she felt very weak and developed a sharp holocephalic headache.  Headache resolved with laying down and would worsen with standing.  She was given fioricet without any benefit so once week later, she had a blood  patch.  Soon after the blood patch was completed, the positional headache resolved, but by the time she was leaving the clinic, she started developing the pressure headache and vision changes again.  Headache worse on the left side, constant, but slightly improved after taking diclofenac.  Pain is currently 4/10, but vision remains unchanged.  She endorses nausea, photophobia, and mild phonophobia.  She was started on topamax and is currently taking 75mg  daily.  Reports mild paresthesias of the hands, but it is not very bothersome.  She was missing too many days of school so had to drop out of school.  Out-side paper records, electronic medical record, and images have been reviewed where available and summarized as:  CSF 9/12   OP 39cm  CP 9cm, 27cc CSF removed    R0 W0 G56 P33   CSF Cryptococcus, VDRL, ACE, lyme MRV brain 07/24/2013: Equivocal MRV head (without). Bilateral distal transverse and sigmoid sinsuses are hypoplastic, which can be seen in association with pseudotumor cerebri or as a normal variant. No definite venous sinus thrombosis. MRA brain 08/03/2013: Normal MRA head (without). MRI brain 07/17/3013: Negative MRI of the brain. Minimal left maxillary sinus disease.    Past Medical History  Diagnosis Date  . ADHD (attention deficit hyperactivity disorder)   . Migraines   . Recurrent urticaria     normal CBC,TSH -?  . Anxiety     induced  . Syncope   . Chest pain on exertion 08/29/2012  . 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 POTS (postural orthostatic tachycardia syndrome)   . 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 Diplopia  07/19/2013  . Pseudotumor cerebri     Past Surgical History  Procedure Laterality Date  . Wisdom    . Wisdom tooth extraction       Medications:  Current Outpatient Prescriptions on File Prior to Visit  Medication Sig Dispense Refill  . Diclofenac Potassium 50 MG PACK Take 50 mg by mouth once as needed. Take once daily as needed with headache onset. Please take  with food  9 each  1  . traMADol (ULTRAM) 50 MG tablet Take 1 tablet (50 mg total) by mouth every 6 (six) hours as needed for pain.  40 tablet  1   No current facility-administered medications on file prior to visit.    Allergies:  Allergies  Allergen Reactions  . Vyvanse [Lisdexamfetamine Dimesylate] Palpitations  . Lo Loestrin Fe [Norethin-Eth Estrad-Fe Biphas] Other (See Comments)    Extreme moodiness  . Mold Extract [Trichophyton] Other (See Comments)    MOLD-DR Sharma-pt has an allergy to cockroaches  . Penicillins Itching, Rash and Other (See Comments)    All penicillins causes rash and itching  . Tamiflu [Oseltamivir Phosphate] Itching and Rash    Family History: Family History  Problem Relation Age of Onset  . Cancer Paternal Grandmother     uterine  . Hypertension Mother   . Cancer Other     brain  . Cancer Other     throat  . Stroke Maternal Uncle     Social History: History   Social History  . Marital Status: Single    Spouse Name: N/A    Number of Children: N/A  . Years of Education: N/A   Occupational History  . Not on file.   Social History Main Topics  . Smoking status: Never Smoker   . Smokeless tobacco: Never Used  . Alcohol Use: Yes     Comment: Occasionally  . Drug Use: No  . Sexual Activity: Yes    Birth Control/ Protection: Pill     Comment: beyaz   Other Topics Concern  . Not on file   Social History Narrative   She is a full-time study and works part-time.  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   Pt does not smoke.                                                                                                                                      Exercise : Yes walking campus daily at school  Education: [employer]    Review of Systems:  CONSTITUTIONAL: No fevers, chills, night sweats, or weight loss.   EYES: +visual changes, no eye  pain ENT: No hearing changes.  No history of nose bleeds.   RESPIRATORY: No cough, wheezing and shortness of breath.   CARDIOVASCULAR: Negative for chest pain, and palpitations.   GI: Negative for abdominal discomfort, blood in stools or black stools.  No recent change in bowel habits.   GU:  No history of incontinence.   MUSCLOSKELETAL: No history of joint pain or swelling.  No myalgias.   SKIN: Negative for lesions, rash, and itching.   HEMATOLOGY/ONCOLOGY: Negative for prolonged bleeding, bruising easily, and swollen nodes.  No history of cancer.   ENDOCRINE: Negative for cold or heat intolerance, polydipsia or goiter.   PSYCH:  No depression or anxiety symptoms.   NEURO: As Above.   Vital Signs:  BP 104/70  Pulse 84  Temp(Src) 98.1 F (36.7 C)  Resp 20  Wt 154 lb (69.854 kg)  BMI 27.29 kg/m2  LMP 07/26/2013 Pain Scale: 4 on a scale of 0-10  Neurological Exam: MENTAL STATUS including orientation to time, place, person, recent and remote memory, attention span and concentration, language, and fund of knowledge is normal.  Speech is not dysarthric.  CRANIAL NERVES: II:  No visual field defects.  Fundoscopic examination was limited due pupilarry constriction and non-dilated exam III-IV-VI: Pupils equal round and reactive to light.  Mild right esotropia at baseline, subtle lateral rectus palsy bilaterally, otherwise nrmal conjugate, extra-ocular eye movements in all directions of gaze.  Fatiguable end-gaze nystagmus is seen in all directions.  Mild left ptosis.   V:  Normal facial sensation.     VII:  Normal facial symmetry and movements.   VIII:  Normal hearing and vestibular function.   IX-X:  Normal palatal movement.   XI:  Normal shoulder shrug and head rotation.   XII:  Normal tongue strength and range of motion, no deviation or fasciculation.  MOTOR:  No atrophy, fasciculations or abnormal movements.  No pronator drift.  Tone is normal.  Positive Lhermitte's sign.  Right  Upper Extremity:    Left Upper Extremity:    Deltoid  5/5   Deltoid  5/5   Biceps  5/5   Biceps  5/5   Triceps  5/5   Triceps  5/5   Wrist extensors  5/5   Wrist extensors  5/5   Wrist flexors  5/5   Wrist flexors  5/5   Finger extensors  5/5   Finger extensors  5/5   Finger flexors  5/5   Finger flexors  5/5   Dorsal interossei  5/5   Dorsal interossei  5/5   Abductor pollicis  5/5   Abductor pollicis  5/5   Tone (Ashworth scale)  0  Tone (Ashworth scale)  0   Right Lower Extremity:    Left Lower Extremity:    Hip flexors  5/5   Hip flexors  5/5   Hip extensors  5/5   Hip extensors  5/5   Knee flexors  5/5   Knee flexors  5/5   Knee extensors  5/5   Knee extensors  5/5   Dorsiflexors  5/5   Dorsiflexors  5/5   Plantarflexors  5/5   Plantarflexors  5/5   Toe extensors  5/5   Toe extensors  5/5   Toe flexors  5/5   Toe flexors  5/5   Tone (Ashworth scale)  0  Tone (Ashworth scale)  0   MSRs:  Right                                                                 Left brachioradialis 2+  brachioradialis 2+  biceps 2+  biceps 2+  triceps 2+  triceps 2+  patellar 2+  patellar 2+  ankle jerk 2+  ankle jerk 2+  Hoffman no  Hoffman no  plantar response down  plantar response down    SENSORY:  Normal and symmetric perception of light touch, pinprick, vibration, and proprioception.  Romberg's sign absent.   COORDINATION/GAIT: Normal finger-to- nose-finger and heel-to-shin.  Intact rapid alternating movements bilaterally.  Able to rise from a chair without using arms.  Gait narrow based and stable. Tandem and stressed gait intact.    IMPRESSION: Ms. Lemke is a 22 year-old female presenting for second opinion of psuedotumor cerebri.  She was initially seen and underwent extensive evaluation at [facility], including MRI brain, MRV, MRA brain, and CSF analysis.  Her opening pressure was 39cm and after 27cc were removed, CP was 9cm.  Despite her pressure headache and vision changes improving  within minutes, she unfortunately developed a severe post-LP headache requiring a blood patch.  Following the blood patch, her positional headache completely resolved, however symptoms related to psuedotumor cerebri returned.  She remains symptomatic with a holocephalic, throbbing headache, horizontal diplopia, and blurry vision which is much better than how it was at onset, but still limits her activity.  Her neurological exam shows mild L ptosis, subtle bilateral CN VI palsy, and positive Lhermitte's sign.  She is currently on topamax 75mg  and being tapered at 25mg  per week.  I had extensive discussion with patient and her mother regarding diagnosis, pathophysiology, management plan including weight loss, and risks and benefits of medications, and prognosis.    PLAN/RECOMMENDATIONS:  1.  Take topiramate:    AM  PM  Week 1 0  75mg    Week 2 0  100mg    Week 3 25mg   100mg  tablet  Week 4 50mg    100mg  tablet  Week 5 75mg   100mg  tablet  Week 6 100mg   100mg  tablet 2.  Taper the diclofenac as able 3.  Stop tramadol 4.  Encouraged weight loss 5.  Follow-up with Opthalmologist next week to reassess papilledema 6.  Risks and benefits of medications discussed, including cessation of medication if patient was planning on getting pregnancy due to risk of fetal defects.  She is not in a relationship and does not have plans any plans to get pregnant. 7.  Return to clinic in 8-month   The duration of this appointment visit was 60 minutes of face-to-face time with the patient.  Greater than 50% of this time was spent in counseling, explanation of diagnosis, planning of further management, and coordination of care.   Thank you for allowing me to participate in patient's care.  If I can answer any additional questions, I would be pleased to do so.    Sincerely,    Donika K. Allena Katz, DO

## 2013-08-09 NOTE — Patient Instructions (Addendum)
1.  Take topiramate:    AM  PM  Week 1 0  75mg  (3 - 25mg  pills)  Week 2 0  100mg  (1 - 100mg  tablet)  Week 3 25mg   100mg  tablet  Week 4 50mg    100mg  tablet  Week 5 75mg   100mg  tablet  Week 6 100mg   100mg  tablet 2.  Taper the diclofenac as able 3.  Stop tramadol 4.  Return to clinic in 35-month

## 2013-09-02 ENCOUNTER — Telehealth: Payer: Self-pay | Admitting: Neurology

## 2013-09-02 NOTE — Telephone Encounter (Signed)
Letter has been written, please forward to patient.

## 2013-09-02 NOTE — Telephone Encounter (Signed)
Pt needs a letter from Dr.Patel explaining that she has been diagnosed with a medical condition and therefore has had to withdraw from [employer] for the rest of the semester. She needs this before the end of the week so that financial aid won't charge her. Let me know when it is ready and she will come by the office and pick it up.

## 2013-09-02 NOTE — Telephone Encounter (Signed)
CB# [phone] for return call from Sarah. Please call her at work, [phone]# [phone] / Sherri S.

## 2013-09-02 NOTE — Telephone Encounter (Signed)
Left message for pt to call back to discuss questions she has about paperwork for school.

## 2013-09-03 ENCOUNTER — Telehealth: Payer: Self-pay | Admitting: Neurology

## 2013-09-03 NOTE — Telephone Encounter (Signed)
Pt has been speaking w/ Maralyn Sago, she called to ask if her letter is ready to be picked up? Please advise / Sherri S.

## 2013-09-03 NOTE — Telephone Encounter (Signed)
Letter is waiting at front desk for pt to come pick up and pt has been notified.

## 2013-09-10 ENCOUNTER — Ambulatory Visit (INDEPENDENT_AMBULATORY_CARE_PROVIDER_SITE_OTHER): Payer: BC Managed Care – PPO | Admitting: Neurology

## 2013-09-10 ENCOUNTER — Encounter: Payer: Self-pay | Admitting: Neurology

## 2013-09-10 VITALS — BP 112/70 | HR 65 | Temp 97.5°F | Resp 12 | Ht 63.0 in | Wt 155.2 lb

## 2013-09-10 DIAGNOSIS — G932 Benign intracranial hypertension: Secondary | ICD-10-CM

## 2013-09-10 DIAGNOSIS — H4710 Unspecified papilledema: Secondary | ICD-10-CM

## 2013-09-10 MED ORDER — FUROSEMIDE 20 MG PO TABS: 20.0000 mg | ORAL_TABLET | Freq: Every day | ORAL | Status: DC

## 2013-09-10 NOTE — Progress Notes (Signed)
[facility] Neurology Division  Follow-up Visit   Date: 09/10/2013    Alexandra Schwartz MRN: [id number] DOB: 05-24-1991   Interim History: Alexandra Schwartz is a 22 y.o. year-old right-handed Hispanic female with history of ADHD, migraines, syncope, anxiety returning to the clinic for follow-up of pseudotumor cerebri.  The patient was accompanied to the clinic by her mother.  She was last seen in the office on 08/09/2013.  Her headaches resolved once she was topiramate 100mg  daily.  Her double vision and blurry vision resolved two weeks ago.  About one week, ago, she developed mild intermittent dull headache.  She has mild tingling of her hands and feet which are not bothersome.  Currently, she is taking TPM 75mg  in the morning and 100mg  at bedtime. Her last eye appointment was on 10/8 which showed mild binasal visual field deficits.  She reports being a little irritable and having increased appetite. She has not been doing any physical exercise which was discussed at our last visit.  Because she was missing too many days from school to to doctor's visits and pain, she has withdrawn from the fall semester of college. 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   History of present illness: Since early August, she started having migraines intermittently and initially thought it was stress-related because of starting school, but on 9/2 she woke up with double vision, with images side-by-side, and severe blurry vision. She saw her PCP who recommended seeing a opthalmologist who noticed that she had early papilledema bilaterally, worse on the left. Headache was described as throbbing, as if head her head was tight and it was going to explode. She had severe photophobia. He then referred her to a neurologist and ordered an MRI brain which was unremarkable. She was seen by Dr. Anne Hahn at [facility] on 9/5 for papilledema and two-week history of left-sided headaches. He noted left ptosis  and ordered MRV and MRA which were essentially unremarkable. She underwent lumbar puncture on 9/12 which showed an opening pressure of 39cm (27cc removed, CP 9cm). CSF analysis was unremarkable. Immediately, following the LP, the pressure headache and vision completely resolved. However, within 10 minutes of having the LP completed, she developed a low-pressure headache which required a blood patch.  Soon after the blood patch was completed, the positional headache resolved, but by the time she was leaving the clinic, she started developing the pressure headache and vision changes again. Headache worse on the left side, constant, but slightly improved after taking diclofenac.  She was started on topamax with titration scheduled.   Medications:  Current Outpatient Prescriptions on File Prior to Visit  Medication Sig Dispense Refill  . topiramate (TOPAMAX) 100 MG tablet Take 1 tablet (100 mg total) by mouth 2 (two) times daily.  60 tablet  3  . topiramate (TOPAMAX) 25 MG capsule Take one tablet daily by mouth, increase by one tablet per week.  60 capsule  3   No current facility-administered medications on file prior to visit.    Allergies:  Allergies  Allergen Reactions  . Vyvanse [Lisdexamfetamine Dimesylate] Palpitations  . Lo Loestrin Fe [Norethin-Eth Estrad-Fe Biphas] Other (See Comments)    Extreme moodiness  . Mold Extract [Trichophyton] Other (See Comments)    MOLD-DR Sharma-pt has an allergy to cockroaches  . Penicillins Itching, Rash and Other (See Comments)    All penicillins causes rash and itching  . Tamiflu [Oseltamivir Phosphate] Itching and Rash     Review of Systems:  CONSTITUTIONAL: No fevers, chills, night sweats, or weight loss.   EYES: No visual changes or eye pain ENT: No hearing changes.  No history of nose bleeds.   RESPIRATORY: No cough, wheezing and shortness of breath.   CARDIOVASCULAR: Negative for chest pain, and palpitations.   GI: Negative for abdominal  discomfort, blood in stools or black stools.  No recent change in bowel habits.   GU:  No history of incontinence.   MUSCLOSKELETAL: No history of joint pain or swelling.  No myalgias.   SKIN: Negative for lesions, rash, and itching.   HEMATOLOGY/ONCOLOGY: Negative for prolonged bleeding, bruising easily, and swollen nodes.  No history of cancer.   ENDOCRINE: Negative for cold or heat intolerance, polydipsia or goiter.   PSYCH:  No depression + anxiety symptoms.   NEURO: As Above.   Vital Signs:  BP 112/70  Pulse 65  Temp(Src) 97.5 F (36.4 C)  Resp 12  Ht 5\' 3"  (1.6 m)  Wt 155 lb 3.2 oz (70.398 kg)  BMI 27.5 kg/m2 Pain Scale: 1 on a scale of 0-10    Neurological Exam: MENTAL STATUS including orientation to time, place, person, recent and remote memory, attention span and concentration, language, and fund of knowledge is normal.  Speech is not dysarthric.  CRANIAL NERVES: II:  No visual field defects.  Blurring of optic disc margins bilaterally.   III-IV-VI: Pupils equal round and reactive to light.  Normal conjugate, extra-ocular eye movements in all directions of gaze.  No nystagmus.  No ptosis.   V:  Normal facial sensation.   VII:  Normal facial symmetry and movements.   VIII:  Normal hearing and vestibular function.   IX-X:  Normal palatal movement.   XI:  Normal shoulder shrug and head rotation.   XII:  Normal tongue strength and range of motion, no deviation or fasciculation.  MOTOR:  No atrophy, fasciculations or abnormal movements.  No pronator drift.  Tone is normal.    Right Upper Extremity:    Left Upper Extremity:    Deltoid  5/5   Deltoid  5/5   Biceps  5/5   Biceps  5/5   Triceps  5/5   Triceps  5/5   Wrist extensors  5/5   Wrist extensors  5/5   Wrist flexors  5/5   Wrist flexors  5/5   Finger extensors  5/5   Finger extensors  5/5   Finger flexors  5/5   Finger flexors  5/5   Dorsal interossei  5/5   Dorsal interossei  5/5   Abductor pollicis  5/5    Abductor pollicis  5/5   Tone (Ashworth scale)  0  Tone (Ashworth scale)  0   Right Lower Extremity:    Left Lower Extremity:    Hip flexors  5/5   Hip flexors  5/5   Hip extensors  5/5   Hip extensors  5/5   Knee flexors  5/5   Knee flexors  5/5   Knee extensors  5/5   Knee extensors  5/5   Dorsiflexors  5/5   Dorsiflexors  5/5   Plantarflexors  5/5   Plantarflexors  5/5   Toe extensors  5/5   Toe extensors  5/5   Toe flexors  5/5   Toe flexors  5/5   Tone (Ashworth scale)  0  Tone (Ashworth scale)  0   MSRs:  Right  Left brachioradialis 2+  brachioradialis 2+  biceps 2+  biceps 2+  triceps 2+  triceps 2+  patellar 2+  patellar 2+  ankle jerk 2+  ankle jerk 2+  Hoffman no  Hoffman no  plantar response down  plantar response down   SENSORY:  Normal and symmetric perception of light touch, pinprick, vibration, and proprioception.  Romberg's sign absent.    COORDINATION/GAIT: Normal finger-to- nose-finger and heel-to-shin.  Intact rapid alternating movements bilaterally.  Able to rise from a chair without using arms.  Gait narrow based and stable. Tandem and stressed gait intact.   Data: CSF 07/26/2013:  OP 39cm CP 9cm, 27cc CSF removed R0 W0 G56 P33        CSF Cryptococcus, VDRL, ACE, lyme - neg MRV brain 07/24/2013: Equivocal MRV head (without). Bilateral distal transverse and sigmoid sinsuses are hypoplastic, which can be seen in association with pseudotumor cerebri or as a normal variant. No definite venous sinus thrombosis.  MRA brain 08/03/2013: Normal MRA head (without).  MRI brain 07/17/3013: Negative MRI of the brain. Minimal left maxillary sinus disease.    IMPRESSION: Pseudotumor cerebri, diagnosed 07/2013 (OP 39cm)  - Clinically, only with mild dull headache. Vision changes including double vision has resolved. Her last visual field testing on 10/8 showed mild binasal visual field deficits. Visual acuity was  20/20 bilaterally.  - However she continues to have papilledema on exam, so I would like to further titrate Topamax and start furosemide  - Since her papilledema has not resolved, I would like for her to have repeat formal visual field testing and dilated eye exam and close followup with me   PLAN/RECOMMENDATIONS:  1.  Increase topamax as follows:    AM  PM  Week 1 100mg   100mg   Week 2 125mg   125mg   Week 3 150mg   150mg   Week 4 175mg   175mg   Week 5 200mg   200mg  2.  Start taking furosemide 20mg  daily 3.  Strongly encouraged weight loss 4.  Repeat dilated eye exam and visual field testing in 2 weeks 5.  Extensive discussion regarding the pathogenesis, clinical course, and management of disease 6.  Return to clinic in 65-month  The duration of this appointment visit was 40 minutes of face-to-face time with the patient.  Greater than 50% of this time was spent in counseling, explanation of diagnosis, planning of further management, and coordination of care.   Thank you for allowing me to participate in patient's care.  If I can answer any additional questions, I would be pleased to do so.    Sincerely,    Donika K. Allena Katz, DO

## 2013-09-10 NOTE — Patient Instructions (Addendum)
1.  Increase topamax as follows:    AM  PM  Week 1 100mg   100mg   Week 2 125mg   125mg   Week 3 150mg   150mg   Week 4 175mg   175mg   Week 5 200mg   200mg  2.  Start taking furosemide 20mg  daily 3.  Strongly encouraged weight loss 4.  Repeat dilated eye exam and visual field testing in 2 weeks 5.  Return to clinic in 40-month

## 2013-09-17 ENCOUNTER — Telehealth: Payer: Self-pay | Admitting: *Deleted

## 2013-09-18 ENCOUNTER — Telehealth: Payer: Self-pay | Admitting: Neurology

## 2013-09-18 NOTE — Telephone Encounter (Signed)
Called patient regarding side effects she was experiencing after starting Lasix. There was no answer, so message was left. Will try patient again tomorrow.  Alexandra K. Allena Katz, DO

## 2013-09-18 NOTE — Telephone Encounter (Signed)
Received call from patient. She reports having swelling and cramps in her hands after starting Lasix 20 mg daily. I instructed increased intake of potassium rich foods (orange juice, bananas) and reduce lasix to 10mg  daily. We can also check her BMP for potassium.  She is scheduled to have dilated eye exam with visual field testing on 11/14.  Donika K. Allena Katz, DO

## 2013-09-19 ENCOUNTER — Other Ambulatory Visit: Payer: Self-pay

## 2013-09-20 ENCOUNTER — Other Ambulatory Visit (INDEPENDENT_AMBULATORY_CARE_PROVIDER_SITE_OTHER): Payer: BC Managed Care – PPO

## 2013-09-20 ENCOUNTER — Other Ambulatory Visit: Payer: Self-pay

## 2013-09-20 ENCOUNTER — Telehealth: Payer: Self-pay

## 2013-09-20 DIAGNOSIS — G932 Benign intracranial hypertension: Secondary | ICD-10-CM

## 2013-09-20 DIAGNOSIS — R55 Syncope and collapse: Secondary | ICD-10-CM

## 2013-09-20 LAB — BASIC METABOLIC PANEL
Calcium: 9.6 mg/dL (ref 8.4–10.5)
Creatinine, Ser: 0.7 mg/dL (ref 0.4–1.2)
GFR: 111.51 mL/min (ref >60.00)
Sodium: 138 mEq/L (ref 135–145)

## 2013-09-20 NOTE — Telephone Encounter (Signed)
Called pt and relayed your message. 

## 2013-09-24 ENCOUNTER — Telehealth: Payer: Self-pay

## 2013-09-24 NOTE — Telephone Encounter (Signed)
Pt called with some concerns about side effects from Topamax, she is having memory loss, confusion and slurring her speech. She said her co workers are also concerned because they have noticed as well. She wants to know what you think she should do and wonders what the difference between Topamax and Diamox is?  Please advise.

## 2013-09-24 NOTE — Telephone Encounter (Signed)
Returned call to patient, but there was no answer.  Message left on voicemail.  Donika K. Allena Katz, DO

## 2013-09-25 MED ORDER — ACETAZOLAMIDE 125 MG PO TABS: ORAL_TABLET | ORAL | Status: DC

## 2013-09-25 NOTE — Telephone Encounter (Signed)
Patient reports having memory problems which are interfering with her job after increasing TPM to 150mg  BID.  She continues to have vision problems.  Will stop TPM and switch to diamox.  She is in agreement of plan.  Kevontay Burks K. Allena Katz, DO

## 2013-09-25 NOTE — Addendum Note (Signed)
Addended by: Glendale Chard on: 09/25/2013 08:47 AM   Modules accepted: Orders, Medications

## 2013-10-04 ENCOUNTER — Telehealth: Payer: Self-pay | Admitting: Neurology

## 2013-10-04 NOTE — Telephone Encounter (Signed)
Letter received from Dr. Roxy Cedar office, Pediatric Ophthalmology dated 09/27/2013.  Visual acuity is 20/20 bilaterally, color vision is normal bilaterally.  Scattered nonspecific visual field deficits are stable since last visual field exam.  Optic disc edema has improved.  Essentially no edema of the right optic disc and only mild elevation of the nasal aspect of the left optic disc.  Donika K. Patel, DO  ---------------------------- Discussed the above with patient.  She is currently taking diamox 250mg  BID.  I recommended increased the dose to 375mg  BID over the next two weeks.  I will titrate further as needed.  Patient in agreement of plan.  Donika K. Allena Katz, DO

## 2013-10-14 ENCOUNTER — Encounter: Payer: Self-pay | Admitting: Neurology

## 2013-10-14 ENCOUNTER — Encounter: Payer: Self-pay | Admitting: *Deleted

## 2013-10-14 ENCOUNTER — Ambulatory Visit (INDEPENDENT_AMBULATORY_CARE_PROVIDER_SITE_OTHER): Payer: BC Managed Care – PPO | Admitting: Neurology

## 2013-10-14 VITALS — BP 105/70 | HR 65 | Temp 97.9°F | Ht 63.0 in | Wt 148.0 lb

## 2013-10-14 DIAGNOSIS — G932 Benign intracranial hypertension: Secondary | ICD-10-CM

## 2013-10-14 MED ORDER — ACETAZOLAMIDE ER 500 MG PO CP12: 500.0000 mg | ORAL_CAPSULE | Freq: Two times a day (BID) | ORAL | Status: DC

## 2013-10-14 NOTE — Progress Notes (Signed)
[facility] Neurology Division  Follow-up Visit   Date: 10/14/2013    Alexandra Schwartz MRN: [id number] DOB: 12-09-1990   Interim History: Alexandra Schwartz is a 22 y.o. year-old right-handed Hispanic female with history of ADHD, migraines, syncope, anxiety returning to the clinic for follow-up of pseudotumor cerebri.  The patient was accompanied to the clinic by her mother.  She was last seen in the office on 09/10/2013.  She called in mid-November with memory problems that were interfering with her job after increasing TPM to 150mg  BID, so we switched her to diamox.  She is currently taking 375mg /375mg .  Lasix was stopped.  Headaches have resolved, but she continues to have problems with her visual fields.  She saw Dr. Maple Hudson on 11/14 whose note indicated that visual acuity is 20/20 bilaterally, color vision is normal bilaterally. Scattered nonspecific visual field deficits are stable since last visual field exam. Optic disc edema has resolved.    She has lost 7lb since her last visit, which is great!  She is also joining a gym this week.    History of present illness: Since early August, she started having migraines intermittently and initially thought it was stress-related because of starting school, but on 9/2 she woke up with double vision, with images side-by-side, and severe blurry vision. She saw her PCP who recommended seeing a opthalmologist who noticed that she had early papilledema bilaterally, worse on the left. Headache was described as throbbing, as if head her head was tight and it was going to explode. She had severe photophobia. He then referred her to a neurologist and ordered an MRI brain which was unremarkable. She was seen by Dr. Anne Hahn at [facility] on 9/5 for papilledema and two-week history of left-sided headaches. He noted left ptosis and ordered MRV and MRA which were essentially unremarkable. She underwent lumbar puncture on 9/12 which showed an opening pressure of 39cm  (27cc removed, CP 9cm). CSF analysis was unremarkable. Immediately, following the LP, the pressure headache and vision completely resolved. However, within 10 minutes of having the LP completed, she developed a low-pressure headache which required a blood patch.  Soon after the blood patch was completed, the positional headache resolved, but by the time she was leaving the clinic, she started developing the pressure headache and vision changes again. Headache worse on the left side, constant, but slightly improved after taking diclofenac.  She was started on topamax which resolved headaches when she got to 100mg  daily.  09/10/2013:  TPM titrated further from 100mg  BID to 200mg  BID and lasix added due to persistent papilledema.  Medications:  Acetazolamide 125mg  take 3 tablets twice daily   Allergies:  Allergies  Allergen Reactions  . Vyvanse [Lisdexamfetamine Dimesylate] Palpitations  . Lo Loestrin Fe [Norethin-Eth Estrad-Fe Biphas] Other (See Comments)    Extreme moodiness  . Mold Extract [Trichophyton] Other (See Comments)    MOLD-DR Sharma-pt has an allergy to cockroaches  . Penicillins Itching, Rash and Other (See Comments)    All penicillins causes rash and itching  . Tamiflu [Oseltamivir Phosphate] Itching and Rash     Review of Systems:  CONSTITUTIONAL: No fevers, chills, night sweats, + intentional 7lb weight loss.   EYES: +visual changes, no eye pain ENT: No hearing changes.  No history of nose bleeds.   RESPIRATORY: No cough, wheezing and shortness of breath.   CARDIOVASCULAR: Negative for chest pain, and palpitations.   GI: Negative for abdominal discomfort, blood in stools or black stools.  No recent  change in bowel habits.   GU:  No history of incontinence.   MUSCLOSKELETAL: No history of joint pain or swelling.  No myalgias.   SKIN: Negative for lesions, rash, and itching.   HEMATOLOGY/ONCOLOGY: Negative for prolonged bleeding, bruising easily, and swollen nodes.    ENDOCRINE: Negative for cold or heat intolerance, polydipsia or goiter.   PSYCH:  No depression or anxiety symptoms.   NEURO: As Above.   Vital Signs:  BP 105/70  Pulse 65  Temp(Src) 97.9 F (36.6 C) (Oral)  Ht 5\' 3"  (1.6 m)  Wt 148 lb (67.132 kg)  BMI 26.22 kg/m2 Pain Scale: 0 on a scale of 0-10    Neurological Exam: MENTAL STATUS including orientation to time, place, person, recent and remote memory, attention span and concentration, language, and fund of knowledge is normal.  Speech is not dysarthric.  CRANIAL NERVES: II:  No visual field defects on routine testing.  Optic discs appear flat. III-IV-VI: Pupils equal round and reactive to light.  Normal conjugate, extra-ocular eye movements in all directions of gaze.  End-gaze physiologic nystagmus.  No ptosis.   V:  Normal facial sensation.   VII:  Normal facial symmetry and movements.   IX-X:  Normal palatal movement.   XI:  Normal shoulder shrug and head rotation.   XII:  Normal tongue strength and range of motion, no deviation or fasciculation.  MOTOR:  Motor strength is 5/5 in all extremities.  No atrophy, fasciculations or abnormal movements.  No pronator drift.  Tone is normal.    MSRs:  Right                                                                 Left brachioradialis 2+  brachioradialis 2+  biceps 2+  biceps 2+  triceps 2+  triceps 2+  patellar 2+  patellar 2+  ankle jerk 2+  ankle jerk 2+   SENSORY:  intact   COORDINATION/GAIT:   Gait narrow based and stable.   Data: CSF 07/26/2013:  OP 39cm CP 9cm, 27cc CSF removed R0 W0 G56 P33        CSF Cryptococcus, VDRL, ACE, lyme - neg MRV brain 07/24/2013: Equivocal MRV head (without). Bilateral distal transverse and sigmoid sinsuses are hypoplastic, which can be seen in association with pseudotumor cerebri or as a normal variant. No definite venous sinus thrombosis.  MRA brain 08/03/2013: Normal MRA head (without).  MRI brain 07/17/3013: Negative MRI of the  brain. Minimal left maxillary sinus disease.    IMPRESSION: Pseudotumor cerebri, diagnosed 07/2013 (OP 39cm)  - Clinically, stable.    - Papilledema resolved, but she still has scattered visual field deficits on formal testing  - TPM stopped due to mental status changes  - She is tolerating diamox, which I will continue to optimize  - She has lost 7lb with diet and exercise!   PLAN/RECOMMENDATIONS:  1.  Increase Diamox to 500mg  twice daily 2.  Continue weight loss 3.  Appreciate Dr. Roxy Cedar correspondence in the ongoing management of this patient's care.  Repeat dilated eye exam and visual field testing in 2 weeks 4.  Letter provided to return to school 5.  Return to clinic in 60-month  The duration of this appointment visit was 30 minutes of face-to-face  time with the patient.  Greater than 50% of this time was spent in counseling, explanation of diagnosis, planning of further management, and coordination of care.   Thank you for allowing me to participate in patient's care.  If I can answer any additional questions, I would be pleased to do so.    Sincerely,    Kamiah Fite K. Posey Pronto, DO

## 2013-10-14 NOTE — Progress Notes (Signed)
faxed

## 2013-10-14 NOTE — Patient Instructions (Signed)
1.  Increase Diamox to 500mg  twice daily 2.  Continue weight loss 3.  Repeat dilated eye exam and visual field testing in 2 weeks 4.  Return to clinic in 81-month

## 2013-10-22 ENCOUNTER — Ambulatory Visit: Payer: BC Managed Care – PPO | Admitting: Neurology

## 2013-11-01 ENCOUNTER — Telehealth: Payer: Self-pay | Admitting: *Deleted

## 2013-11-01 NOTE — Telephone Encounter (Signed)
Returned call to patient.  She was taking OC for acne and says she is starting to break out again.  I have asked her to see if there are other alternative besides estrogen, since her pseudotumor symptoms have not completely stabilized.  She expresses understanding and will discuss with her OBGYN.  Jahzion Brogden K. Allena Katz, DO

## 2013-11-01 NOTE — Telephone Encounter (Signed)
Patient would like to know if she can take birth control

## 2013-11-19 ENCOUNTER — Encounter: Payer: Self-pay | Admitting: Neurology

## 2013-11-19 ENCOUNTER — Ambulatory Visit (INDEPENDENT_AMBULATORY_CARE_PROVIDER_SITE_OTHER): Payer: BC Managed Care – PPO | Admitting: Neurology

## 2013-11-19 VITALS — BP 98/68 | HR 72 | Temp 97.4°F | Ht 63.0 in | Wt 149.2 lb

## 2013-11-19 DIAGNOSIS — G932 Benign intracranial hypertension: Secondary | ICD-10-CM

## 2013-11-19 NOTE — Progress Notes (Signed)
[facility] Neurology Division  Follow-up Visit   Date: 11/19/2013    Alexandra Schwartz MRN: [id number] DOB: [birth year]   Interim History: Alexandra Schwartz is a 23 y.o. year-old right-handed Hispanic female with history of ADHD, migraines, syncope, anxiety returning to the clinic for follow-up of pseudotumor cerebri.  She was last seen in the office on 10/14/2013.  She reports doing well over the past month.  Yesterday, she had a right sided throbbing headache, which was new for her because she had not had headaches in several months.  There was associated photosensitivity, no nausea.  She is feeling better today.  She continues to have problems with visual fields, but there has been no worsening.  Denies any new vision problems.  She is starting college again next week.  Due to the holidays, she has gained a few pounds, but overall has remained to keep > 5lb off.    History of present illness: Since early August, she started having migraines intermittently and initially thought it was stress-related because of starting school, but on 9/2 she woke up with double vision, with images side-by-side, and severe blurry vision. She saw her PCP who recommended seeing a opthalmologist who noticed that she had early papilledema bilaterally, worse on the left. Headache was described as throbbing, as if head her head was tight and it was going to explode. She had severe photophobia.  She was seen by Dr. Anne Hahn at [facility] on 9/5 for papilledema and two-week history of left-sided headaches. He noted left ptosis and ordered MRV and MRA which were essentially unremarkable. She underwent lumbar puncture on 9/12 which showed an opening pressure of 39cm (27cc removed, CP 9cm). CSF analysis was unremarkable. Immediately, following the LP, the pressure headache and vision completely resolved. However, within 10 minutes of having the LP completed, she developed a low-pressure headache which required a blood patch.  Soon  after the blood patch was completed, the positional headache resolved, but by the time she was leaving the clinic, she started developing the pressure headache and vision changes again. Headache worse on the left side, constant, but slightly improved after taking diclofenac.  She was started on topamax which resolved headaches when she got to 100mg  daily.  09/10/2013:  TPM titrated further from 100mg  BID to 200mg  BID and lasix added due to persistent papilledema.  09/24/2013:  Due to memory problems on TPM, switched her to diamox.  10/15/2013:  Diamox increased 500mg  BID.  Papilledema resolved, stable scattered visual field deficits on formal testing.   Medications:  Current Outpatient Prescriptions on File Prior to Visit  Medication Sig Dispense Refill  . acetaZOLAMIDE (DIAMOX) 500 MG capsule Take 1 capsule (500 mg total) by mouth 2 (two) times daily.  60 capsule  5   No current facility-administered medications on file prior to visit.    Allergies:  Allergies  Allergen Reactions  . Vyvanse [Lisdexamfetamine Dimesylate] Palpitations  . Lo Loestrin Fe [Norethin-Eth Estrad-Fe Biphas] Other (See Comments)    Extreme moodiness  . Mold Extract [Trichophyton] Other (See Comments)    MOLD-DR Sharma-pt has an allergy to cockroaches  . Penicillins Itching, Rash and Other (See Comments)    All penicillins causes rash and itching  . Tamiflu [Oseltamivir Phosphate] Itching and Rash     Review of Systems:  CONSTITUTIONAL: No fevers, chills, night sweats, + intentional 7lb weight loss.   EYES: +visual changes, no eye pain ENT: No hearing changes.  No history of nose bleeds.   RESPIRATORY:  No cough, wheezing and shortness of breath.   CARDIOVASCULAR: Negative for chest pain, and palpitations.   GI: Negative for abdominal discomfort, blood in stools or black stools.  No recent change in bowel habits.   GU:  No history of incontinence.   MUSCLOSKELETAL: No history of joint pain or swelling.  No  myalgias.   SKIN: Negative for lesions, rash, and itching.   HEMATOLOGY/ONCOLOGY: Negative for prolonged bleeding, bruising easily, and swollen nodes.   ENDOCRINE: Negative for cold or heat intolerance, polydipsia or goiter.   PSYCH:  No depression or anxiety symptoms.   NEURO: As Above.   Vital Signs:  BP 98/68  Pulse 72  Temp(Src) 97.4 F (36.3 C) (Oral)  Ht 5\' 3"  (1.6 m)  Wt 149 lb 3.2 oz (67.677 kg)  BMI 26.44 kg/m2 Pain Scale: 0 on a scale of 0-10    Neurological Exam: MENTAL STATUS including orientation to time, place, person, recent and remote memory, attention span and concentration, language, and fund of knowledge is normal.  Speech is not dysarthric.  CRANIAL NERVES: No visual field defects. Optic discs appear flat.  Pupils equal round and reactive to light. Normal conjugate, extra-ocular eye movements in all directions of gaze with end-point physiological nystagmus bilaterally. No ptosis. Normal facial sensation. Face is symmetric. Palate elevates symmetrically. Tongue is midline.   MOTOR:  Motor strength is 5/5 in all extremities.  No pronator drift.  Tone is normal.    MSRs:  Reflexes are 2+/4 throughout  SENSORY:  intact   COORDINATION/GAIT:   No dysmetria with finger to nose testing.  Gait narrow based and stable.   Data: CSF 07/26/2013:  OP 39cm CP 9cm, 27cc CSF removed R0 W0 G56 P33        CSF Cryptococcus, VDRL, ACE, lyme - neg MRV brain 07/24/2013: Equivocal MRV head (without). Bilateral distal transverse and sigmoid sinsuses are hypoplastic, which can be seen in association with pseudotumor cerebri or as a normal variant. No definite venous sinus thrombosis.  MRA brain 08/03/2013: Normal MRA head (without).  MRI brain 07/17/3013: Negative MRI of the brain. Minimal left maxillary sinus disease.    IMPRESSION: Pseudotumor cerebri, diagnosed 07/2013 (OP 39cm)  - Clinically, stable.    - Papilledema resolved, but she still has scattered visual field deficits on  formal testing  - TPM stopped due to mental status changes  - She is tolerating diamox 500mg  BID  - She has joined a gym to help with weight loss.   PLAN/RECOMMENDATIONS:  1.  Continue Diamox to 500mg  twice daily for now.  If she develops new vision changes or worsening headaches, low threshold to increase the dose.   2.  Continue weight loss, encouraged healthy goal of 1lb per week  3.  Appreciate Dr. Roxy CedarYoung's care in the management of this patient's care.  She is scheduled for dilated eye exam and visual field testing next month 4.  Return to clinic in 5670-month  The duration of this appointment visit was 25 minutes of face-to-face time with the patient.  Greater than 50% of this time was spent in counseling, explanation of diagnosis, planning of further management, and coordination of care.   Thank you for allowing me to participate in patient's care.  If I can answer any additional questions, I would be pleased to do so.    Sincerely,    Jaysun Wessels K. Allena KatzPatel, DO

## 2013-11-19 NOTE — Patient Instructions (Addendum)
1.  Continue acetazolamide 500mg  twice daily 2.  Encouraged weight loss of 1lb per week 3.  Send me a MyChart message of and new headaches or vision changes  4.  I will see you again in 232-months

## 2014-01-21 ENCOUNTER — Ambulatory Visit: Payer: BC Managed Care – PPO | Admitting: Neurology

## 2014-01-28 ENCOUNTER — Telehealth: Payer: Self-pay | Admitting: Neurology

## 2014-01-28 NOTE — Telephone Encounter (Signed)
Letter rec'd from Dr. Roxy CedarYoung's office dated 12/12/2013.  No HA or diplopia.  With correction of refractive error, vision is 20/20 OU. Optic discs are sharp, flat, pink, and healthy.  Will scan into Epic.  Donika K. Allena KatzPatel, DO

## 2014-02-24 ENCOUNTER — Encounter: Payer: Self-pay | Admitting: Neurology

## 2014-02-24 ENCOUNTER — Ambulatory Visit (INDEPENDENT_AMBULATORY_CARE_PROVIDER_SITE_OTHER): Payer: BC Managed Care – PPO | Admitting: Neurology

## 2014-02-24 VITALS — BP 100/70 | HR 68 | Wt 154.3 lb

## 2014-02-24 DIAGNOSIS — G932 Benign intracranial hypertension: Secondary | ICD-10-CM

## 2014-02-24 DIAGNOSIS — Z713 Dietary counseling and surveillance: Secondary | ICD-10-CM

## 2014-02-24 MED ORDER — ACETAZOLAMIDE ER 500 MG PO CP12: 500.0000 mg | ORAL_CAPSULE | Freq: Two times a day (BID) | ORAL | Status: DC

## 2014-02-24 NOTE — Progress Notes (Signed)
Note faxed.

## 2014-02-24 NOTE — Progress Notes (Signed)
[facility] Neurology Division  Follow-up Visit   Date: 02/24/2014    Alexandra AlamoKelly A Michalik MRN: [id number] DOB: 06-18-1991   Interim History: Alexandra AlamoKelly A Schwartz is a 23 y.o. year-old right-handed Hispanic female with history of ADHD, migraines, syncope, anxiety returning to the clinic for follow-up of pseudotumor cerebri.  She was last seen in the office on 11/19/2013.   History of present illness: Since early August, she started having migraines intermittently and initially thought it was stress-related because of starting school, but on 9/2 she woke up with double vision, with images side-by-side, and severe blurry vision. She saw her PCP who recommended seeing a opthalmologist who noticed that she had early papilledema bilaterally, worse on the left. Headache was described as throbbing, as if head her head was tight and it was going to explode. She had severe photophobia.  [facility] for papilledema and two-week history of left-sided headaches. He noted left ptosis and ordered MRV and MRA which were essentially unremarkable. She underwent lumbar puncture on 9/12 which showed an opening pressure of 39cm (27cc removed, CP 9cm). CSF analysis was unremarkable. Immediately, following the LP, the pressure headache and vision completely resolved. However, within 10 minutes of having the LP completed, she developed a low-pressure headache which required a blood patch.  Soon after the blood patch was completed, the positional headache resolved, but by the time she was leaving the clinic, she started developing the pressure headache and vision changes again. Headache worse on the left side, constant, but slightly improved after taking diclofenac.  She was started on topamax which resolved headaches when she got to 100mg  daily.  09/10/2013:  TPM titrated further from 100mg  BID to 200mg  BID and lasix added due to persistent papilledema.  09/24/2013:  Due to memory problems on  TPM, switched her to diamox.  10/15/2013:  Diamox increased 500mg  BID.  Papilledema resolved, stable scattered visual field deficits on formal testing.  11/19/2013:  She reports doing well over the past month.  Yesterday, she had a right sided throbbing headache, which was new for her because she had not had headaches in several months.  There was associated photosensitivity, no nausea.  She is feeling better today.  She continues to have problems with visual fields, but there has been no worsening.  Denies any new vision problems.  She is starting college again next week.  Due to the holidays, she has gained a few pounds, but overall has remained to keep > 5lb off.   - Follow-up 02/24/2014: She is very stressed with graduation coming up in May and has gained 5lb since last visit.  She reports noticing a right sided headache which started a one week ago. It lasts from a few hours to all day, occurs 2-3 times per week. It is improved with rest and extra strength ibuprofen.  She has mild nausea, no photophobita or phonophobia.  No new vision changes.   Medications:  Current Outpatient Prescriptions on File Prior to Visit  Medication Sig Dispense Refill  . acetaZOLAMIDE (DIAMOX) 500 MG capsule Take 1 capsule (500 mg total) by mouth 2 (two) times daily.  60 capsule  5   No current facility-administered medications on file prior to visit.    Allergies:  Allergies  Allergen Reactions  . Vyvanse [Lisdexamfetamine Dimesylate] Palpitations  . Lo Loestrin Fe [Norethin-Eth Estrad-Fe Biphas] Other (See Comments)    Extreme moodiness  . Mold Extract [Trichophyton] Other (See Comments)  MOLD-DR Sharma-pt has an allergy to cockroaches  . Penicillins Itching, Rash and Other (See Comments)    All penicillins causes rash and itching  . Tamiflu [Oseltamivir Phosphate] Itching and Rash     Review of Systems:  CONSTITUTIONAL: No fevers, chills, night sweats.   EYES: No visual changes, no eye pain ENT: No  hearing changes.  No history of nose bleeds.   RESPIRATORY: No cough, wheezing and shortness of breath.   CARDIOVASCULAR: Negative for chest pain, and palpitations.   GI: Negative for abdominal discomfort, blood in stools or black stools.  No recent change in bowel habits.   GU:  No history of incontinence.   MUSCLOSKELETAL: No history of joint pain or swelling.  No myalgias.   SKIN: Negative for lesions, rash, and itching.   HEMATOLOGY/ONCOLOGY: Negative for prolonged bleeding, bruising easily, and swollen nodes.   ENDOCRINE: Negative for cold or heat intolerance, polydipsia or goiter.   PSYCH:  No depression or anxiety symptoms.   NEURO: As Above.   Vital Signs:  BP 100/70  Pulse 68  Wt 154 lb 5 oz (69.996 kg)  SpO2 98% Pain Scale: 0 on a scale of 0-10    Neurological Exam: MENTAL STATUS including orientation to time, place, person, recent and remote memory, attention span and concentration, language, and fund of knowledge is normal.  Speech is not dysarthric.  CRANIAL NERVES: No visual field defects. Optic discs appear flat and margins are sharp.  Pupils equal round and reactive to light. Normal conjugate, extra-ocular eye movements in all directions of gaze with end-point physiological nystagmus bilaterally. Subtle left ptosis. Normal facial sensation. Face is symmetric. Palate elevates symmetrically. Tongue is midline.   MOTOR:  Motor strength is 5/5 in all extremities.  No pronator drift.  Tone is normal.    MSRs:  Reflexes are 2+/4 throughout  SENSORY:  intact   COORDINATION/GAIT:   No dysmetria with finger to nose testing.  Gait narrow based and stable.   Data: CSF 07/26/2013:  OP 39cm CP 9cm, 27cc CSF removed R0 W0 G56 P33        CSF Cryptococcus, VDRL, ACE, lyme - neg MRV brain 07/24/2013: Equivocal MRV head (without). Bilateral distal transverse and sigmoid sinsuses are hypoplastic, which can be seen in association with pseudotumor cerebri or as a normal variant. No  definite venous sinus thrombosis.  MRA brain 08/03/2013: Normal MRA head (without).  MRI brain 07/17/3013: Negative MRI of the brain. Minimal left maxillary sinus disease.    IMPRESSION: Pseudotumor cerebri, diagnosed 07/2013 (OP 39cm)  - Clinically, stable.    - Papilledema resolved, but she still has scattered visual field deficits on formal testing  - TPM stopped due to mental status changes  - She is tolerating diamox 500mg  BID Episodic headaches with migrainous features   - Do not suspect worsening of pseudotumor contributing to headaches as there is no papilledema  - Discussed increasing diamox, but she would like to try weight loss first  - If there is new vision changes or change in character of headache, low threshold to increase dose Overweight with 5lb weight gain in past three months  - Lengthy discussion about where her ideal body weight should be and strategies for weight loss (calorie counting, diet program, exercise)   PLAN/RECOMMENDATIONS:  1.  Continue Diamox to 500mg  twice daily 2.  OK to use ibuprofen or naprosyn for headaches, but no more than twice per week 3.  Strongly recommended weight loss, encouraged healthy goal of  1lb per week.   4.  Return to clinic in 7381-month   The duration of this appointment visit was 30 minutes of face-to-face time with the patient.  Greater than 50% of this time was spent in counseling, explanation of diagnosis, planning of further management, and coordination of care.   Thank you for allowing me to participate in patient's care.  If I can answer any additional questions, I would be pleased to do so.    Sincerely,    Marce Schartz K. Allena KatzPatel, DO

## 2014-02-24 NOTE — Patient Instructions (Addendum)
1.  Continue diamox 500mg  BID 2.  OK to use ibuprofen or naprosyn for headaches, but no more than twice per week 3.  Strongly encouraged weight loss 3.  Return to clinic in 3836-month

## 2014-05-27 ENCOUNTER — Telehealth: Payer: Self-pay | Admitting: Neurology

## 2014-05-27 ENCOUNTER — Ambulatory Visit: Payer: BC Managed Care – PPO | Admitting: Neurology

## 2014-05-27 NOTE — Telephone Encounter (Signed)
Pt canceled her 05/27/14 f/u appt. She did not state why. She will call later to r/s.

## 2014-10-23 ENCOUNTER — Encounter (HOSPITAL_COMMUNITY): Payer: Self-pay | Admitting: Internal Medicine

## 2015-01-28 ENCOUNTER — Ambulatory Visit: Payer: BC Managed Care – PPO | Admitting: Neurology

## 2015-02-10 ENCOUNTER — Encounter: Payer: Self-pay | Admitting: Neurology

## 2015-02-10 ENCOUNTER — Ambulatory Visit (INDEPENDENT_AMBULATORY_CARE_PROVIDER_SITE_OTHER): Payer: BC Managed Care – PPO | Admitting: Neurology

## 2015-02-10 VITALS — BP 110/70 | HR 77 | Ht 63.0 in | Wt 175.0 lb

## 2015-02-10 DIAGNOSIS — Z713 Dietary counseling and surveillance: Secondary | ICD-10-CM

## 2015-02-10 DIAGNOSIS — G932 Benign intracranial hypertension: Secondary | ICD-10-CM

## 2015-02-10 MED ORDER — ACETAZOLAMIDE ER 500 MG PO CP12: 500.0000 mg | ORAL_CAPSULE | Freq: Two times a day (BID) | ORAL | Status: AC

## 2015-02-10 NOTE — Progress Notes (Signed)
Note sent

## 2015-02-10 NOTE — Progress Notes (Signed)
[facility] Neurology Division  Follow-up Visit   Date: 02/10/2015    Alexandra Schwartz MRN: [id number] DOB: 1991/02/11   Interim History: Alexandra Schwartz is a 24 y.o. year-old right-handed Hispanic female with history of ADHD, migraines, syncope, anxiety returning to the clinic for follow-up of pseudotumor cerebri.    History of present illness: Since early August 2014, she started having migraines intermittently and initially thought it was stress-related because of starting school, but on 9/2 she woke up with double vision, with images side-by-side, and severe blurry vision.  Opthalmologist who noticed that she had early papilledema bilaterally, worse on the left. Headache was described as throbbing, as if head her head was tight and it was going to explode. She had severe photophobia.  She was seen by Dr. Anne Hahn at [facility] on 9/5 for papilledema and two-week history of left-sided headaches. He noted left ptosis and ordered MRV and MRA which were essentially unremarkable. She underwent lumbar puncture on 9/12 which showed an opening pressure of 39cm (27cc removed, CP 9cm). CSF analysis was unremarkable. Immediately, following the LP, the pressure headache and vision completely resolved. However, within 10 minutes of having the LP completed, she developed a low-pressure headache which required a blood patch.  Soon after the blood patch was completed, the positional headache resolved, but by the time she was leaving the clinic, she started developing the pressure headache and vision changes again. Headache worse on the left side, constant, but slightly improved after taking diclofenac.  She was started on topamax which resolved headaches when she got to  daily.  09/10/2013:  TPM titrated further from  BID to  BID and lasix added due to persistent papilledema.  09/24/2013:  Due to memory problems on TPM, switched her to diamox.  10/15/2013:  Diamox increased  BID.   Papilledema resolved, stable scattered visual field deficits on formal testing.   02/24/2014: She is very stressed with graduation coming up in May and has gained 5lb since last visit.  She reports noticing a right sided headache which started a one week ago. It lasts from a few hours to all day, occurs 2-3 times per week. It is improved with rest and extra strength ibuprofen.    - UPDATE 02/10/2015:  Patient was last seen in April 2015 and is here for follow-up.  She had eye examination in January 2016 which did not find any papilledema or visual field deficits.  She has been complaint with her medication.  She has gained 20lb over the past year and feels that it has has been stress-related and feels that it has made her headaches worse.  She started with a Systems analyst.  Headaches are about once per week and respond to NSAIDs.  No visual complaints today.  Medications:  Diamox  BID  Allergies:  Allergies  Allergen Reactions  . Vyvanse [Lisdexamfetamine Dimesylate] Palpitations  . Lo Loestrin Fe [Norethin-Eth Estrad-Fe Biphas] Other (See Comments)    Extreme moodiness  . Mold Extract [Trichophyton] Other (See Comments)    MOLD-DR Sharma-pt has an allergy to cockroaches  . Penicillins Itching, Rash and Other (See Comments)    All penicillins causes rash and itching  . Tamiflu [Oseltamivir Phosphate] Itching and Rash    Review of Systems:  CONSTITUTIONAL: No fevers, chills, night sweats.  +weight gain EYES: No visual changes, no eye pain ENT: No hearing changes.  No history of nose bleeds.   RESPIRATORY: No cough, wheezing and shortness of breath.   CARDIOVASCULAR: Negative  for chest pain, and palpitations.   GI: Negative for abdominal discomfort, blood in stools or black stools.  No recent change in bowel habits.   GU:  No history of incontinence.   MUSCLOSKELETAL: No history of joint pain or swelling.  No myalgias.   SKIN: Negative for lesions, rash, and itching.    HEMATOLOGY/ONCOLOGY: Negative for prolonged bleeding, bruising easily, and swollen nodes.   ENDOCRINE: Negative for cold or heat intolerance, polydipsia or goiter.   PSYCH:  No depression or anxiety symptoms.   NEURO: As Above.   Vital Signs:  BP 110/70 mmHg  Pulse 77  Ht 5\' 3"  (1.6 m)  Wt 175 lb (79.379 kg)  BMI 31.01 kg/m2  SpO2 98% Pain Scale: 0 on a scale of 0-10    Neurological Exam: MENTAL STATUS including orientation to time, place, person, recent and remote memory, attention span and concentration, language, and fund of knowledge is normal.  Speech is not dysarthric.  CRANIAL NERVES: No visual field defects. Optic discs appear flat and margins are sharp.  Pupils equal round and reactive to light. Normal conjugate, extra-ocular eye movements in all directions of gaze. No ptosis. Normal facial sensation. Face is symmetric. Palate elevates symmetrically. Tongue is midline.   MOTOR:  Motor strength is 5/5 in all extremities.  No pronator drift.  Tone is normal.    MSRs:  Reflexes are 2+/4 throughout  SENSORY:  intact to vibration  COORDINATION/GAIT:    Gait narrow based and stable.   Data: CSF 07/26/2013:  OP 39cm CP 9cm, 27cc CSF removed R0 W0 G56 P33        CSF Cryptococcus, VDRL, ACE, lyme - neg MRV brain 07/24/2013: Equivocal MRV head (without). Bilateral distal transverse and sigmoid sinsuses are hypoplastic, which can be seen in association with pseudotumor cerebri or as a normal variant. No definite venous sinus thrombosis.  MRA brain 08/03/2013: Normal MRA head (without).  MRI brain 07/17/3013: Negative MRI of the brain. Minimal left maxillary sinus disease.    IMPRESSION: Pseudotumor cerebri, diagnosed 07/2013 (OP 39cm) Clinically, stable.  No visual field deficits or papilledema. TPM stopped due to mental status changes She is tolerating diamox 500mg  BID and we discussed tapering the dose, but patient is concerned about worsening headaches with her weight gain and  would like to try to loose weight become tapering the dose, which is reasonable especially since her headaches were very difficult to control previously.  Overweight with 20lb weight gain in past year Lengthy discussion about where her ideal body weight should be and strategies for weight loss (calorie counting, diet program, exercise)   PLAN/RECOMMENDATIONS:  1.  Continue Diamox to 500mg  twice daily for now 2.  OK to use ibuprofen or naprosyn for headaches, but no more than twice per week 3.  Strongly recommended weight loss, encouraged healthy goal of 1lb per week.   4.  Return to clinic in 2461-month at which time we can reassess tapering her diamox   The duration of this appointment visit was 25 minutes of face-to-face time with the patient.  Greater than 50% of this time was spent in counseling, explanation of diagnosis, planning of further management, and coordination of care.   Thank you for allowing me to participate in patient's care.  If I can answer any additional questions, I would be pleased to do so.    Sincerely,    Donika K. Allena KatzPatel, DO

## 2015-02-10 NOTE — Patient Instructions (Signed)
Strongly recommended weight loss, encouraged healthy goal of 1-2lb per week. I will see you back in 6 months

## 2015-08-03 ENCOUNTER — Telehealth: Payer: Self-pay | Admitting: Neurology

## 2015-08-03 NOTE — Telephone Encounter (Signed)
Pt called stating that she is having severe nausea/wanted check if it was ok to see her PCP for those symptoms/ due to the fact her f/u appt isn't until this Fri.//Call back @ [phone]

## 2015-08-03 NOTE — Telephone Encounter (Signed)
Patient notified ok to see her PCP.

## 2015-08-07 ENCOUNTER — Encounter: Payer: Self-pay | Admitting: Neurology

## 2015-08-07 ENCOUNTER — Ambulatory Visit (INDEPENDENT_AMBULATORY_CARE_PROVIDER_SITE_OTHER): Payer: BC Managed Care – PPO | Admitting: Neurology

## 2015-08-07 VITALS — BP 100/60 | HR 60 | Ht 63.0 in | Wt 163.3 lb

## 2015-08-07 DIAGNOSIS — G932 Benign intracranial hypertension: Secondary | ICD-10-CM

## 2015-08-07 DIAGNOSIS — G43709 Chronic migraine without aura, not intractable, without status migrainosus: Secondary | ICD-10-CM | POA: Diagnosis not present

## 2015-08-07 DIAGNOSIS — IMO0002 Reserved for concepts with insufficient information to code with codable children: Secondary | ICD-10-CM

## 2015-08-07 MED ORDER — TOPIRAMATE ER 50 MG PO CAP24: 1.0000 | ORAL_CAPSULE | Freq: Every day | ORAL | Status: DC

## 2015-08-07 MED ORDER — TOPIRAMATE ER 25 MG PO CAP24: 1.0000 | ORAL_CAPSULE | Freq: Every day | ORAL | Status: DC

## 2015-08-07 NOTE — Progress Notes (Signed)
[facility] Neurology Division  Follow-up Visit   Date: 08/07/2015    Alexandra Schwartz MRN: [id number] DOB: 20-Jan-1991   Interim History: Alexandra Schwartz is a 24 y.o. year-old right-handed Hispanic female with history of ADHD, migraines, syncope, anxiety returning to the clinic for follow-up of pseudotumor cerebri.    History of present illness: Since early August 2014, she started having migraines intermittently and initially thought it was stress-related because of starting school, but on 9/2 she woke up with double vision, with images side-by-side, and severe blurry vision.  Opthalmologist who noticed that she had early papilledema bilaterally, worse on the left. Headache was described as throbbing, as if head her head was tight and it was going to explode. She had severe photophobia.  She was seen by Dr. Anne Hahn at [facility] on 9/5 for papilledema and two-week history of left-sided headaches. He noted left ptosis and ordered MRV and MRA which were essentially unremarkable. She underwent lumbar puncture on 9/12 which showed an opening pressure of 39cm (27cc removed, CP 9cm). CSF analysis was unremarkable. Immediately, following the LP, the pressure headache and vision completely resolved. However, within 10 minutes of having the LP completed, she developed a low-pressure headache which required a blood patch.  Soon after the blood patch was completed, the positional headache resolved, but by the time she was leaving the clinic, she started developing the pressure headache and vision changes again. Headache worse on the left side, constant, but slightly improved after taking diclofenac.  She was started on topamax which resolved headaches when she got to  daily.  09/10/2013:  TPM titrated further from  BID to  BID and lasix added due to persistent papilledema.  09/24/2013:  Due to memory problems on TPM, switched her to diamox.  10/15/2013:  Diamox increased  BID.   Papilledema resolved, stable scattered visual field deficits on formal testing.   02/24/2014: She is very stressed with graduation coming up in May and has gained 5lb since last visit.  She reports noticing a right sided headache which started a one week ago. It lasts from a few hours to all day, occurs 2-3 times per week. It is improved with rest and extra strength ibuprofen.    - UPDATE 02/10/2015:  Patient was last seen in April 2015 and is here for follow-up.  She had eye examination in January 2016 which did not find any papilledema or visual field deficits.  She has been complaint with her medication.  She has gained 20lb over the past year and feels that it has has been stress-related and feels that it has made her headaches worse.  She started with a Systems analyst.  Headaches are about once per week and respond to NSAIDs.  No visual complaints today.  UPDATE 08/07/2015:  She reports no new vision changes taking diamox  twice daily.  She had blurry vision, which occurs preceding her migraines, which have increased in frequency, which has not changed in quality.  She gets them daily and is taking OTC daily.  Stress is a huge component right now because she is 40 hours per week, started Masters in [employer] studies, and has family related issues.  She is seeing a Haematologist next week and hope this will help her stress.   Medications:  Diamox  BID  Allergies:  Allergies  Allergen Reactions  . Vyvanse [Lisdexamfetamine Dimesylate] Palpitations  . Lo Loestrin Fe [Norethin-Eth Estrad-Fe Biphas] Other (See Comments)    Extreme moodiness  .  Mold Extract [Trichophyton] Other (See Comments)    MOLD-DR Sharma-pt has an allergy to cockroaches  . Penicillins Itching, Rash and Other (See Comments)    All penicillins causes rash and itching  . Tamiflu [Oseltamivir Phosphate] Itching and Rash    Review of Systems:  CONSTITUTIONAL: No fevers, chills, night sweats.  +weight  gain EYES: No visual changes, no eye pain ENT: No hearing changes.  No history of nose bleeds.   RESPIRATORY: No cough, wheezing and shortness of breath.   CARDIOVASCULAR: Negative for chest pain, and palpitations.   GI: Negative for abdominal discomfort, blood in stools or black stools.  No recent change in bowel habits.   GU:  No history of incontinence.   MUSCLOSKELETAL: No history of joint pain or swelling.  No myalgias.   SKIN: Negative for lesions, rash, and itching.   HEMATOLOGY/ONCOLOGY: Negative for prolonged bleeding, bruising easily, and swollen nodes.   ENDOCRINE: Negative for cold or heat intolerance, polydipsia or goiter.   PSYCH:  No depression or anxiety symptoms.   NEURO: As Above.   Vital Signs:  BP 100/60 mmHg  Pulse 60  Ht  (1.6 m)  Wt 163 lb 5 oz (74.078 kg)  BMI 28.94 kg/m2  SpO2 99% Pain Scale: 0 on a scale of 0-10    Neurological Exam: MENTAL STATUS including orientation to time, place, person, recent and remote memory, attention span and concentration, language, and fund of knowledge is normal.  Speech is not dysarthric.  CRANIAL NERVES: No visual field defects. Optic discs appear flat and margins are sharp.  Pupils equal round and reactive to light. Normal conjugate, extra-ocular eye movements in all directions of gaze. No ptosis. Normal facial sensation. Face is symmetric. Palate elevates symmetrically. Tongue is midline.   MOTOR:  Motor strength is 5/5 in all extremities.  No pronator drift.  Tone is normal.    MSRs:  Reflexes are 2+/4 throughout  SENSORY:  intact to vibration  COORDINATION/GAIT:    Gait narrow based and stable.   Data: CSF 07/26/2013:  OP 39cm CP 9cm, 27cc CSF removed R0 W0 G56 P33        CSF Cryptococcus, VDRL, ACE, lyme - neg MRV brain 07/24/2013: Equivocal MRV head (without). Bilateral distal transverse and sigmoid sinsuses are hypoplastic, which can be seen in association with pseudotumor cerebri or as a normal variant. No  definite venous sinus thrombosis.  MRA brain 08/03/2013: Normal MRA head (without).  MRI brain 07/17/3013: Negative MRI of the brain. Minimal left maxillary sinus disease.    IMPRESSION: Pseudotumor cerebri, diagnosed 07/2013 (OP 39cm) Clinically, stable.  No visual field deficits or papilledema. TPM stopped due to mental status changes  To avoid cognitive side effects of topamax, start trokendi for migraine  If she is tolerating Trokendi, plan to taper diamox as her discs appear flat today   Chronic migraine Start Trokendi as to avoid cognitive side effects of topiramate and we can use to also treat psuedotumor  Overweight Encouraged exercise of walking at least 30 min 3 times per week   PLAN/RECOMMENDATIONS:  1.  Continue Diamox to  twice daily for now 2.  Start Trokendi  daily for one week, then increase to Trokendi  daily.  Common side effects discussed.  No plans for pregnancy. 3.  Limited ibuprofen or naprosyn for headaches to twice per week   4. Recommend dilated eye exam, if this is stable and she is doing well on Trokendi, plan to taper diamox  Return  to clinic in 36-months    The duration of this appointment visit was 25 minutes of face-to-face time with the patient.  Greater than 50% of this time was spent in counseling, explanation of diagnosis, planning of further management, and coordination of care.   Thank you for allowing me to participate in patient's care.  If I can answer any additional questions, I would be pleased to do so.    Sincerely,    Donika K. Allena Katz, DO

## 2015-08-07 NOTE — Patient Instructions (Addendum)
1.  Continue Diamox  twice daily 2.  Start Trokendi  daily for one week (samples provided), then increase to  daily.  The prescription for  tablets has been sent to your pharmacy.   3.  If you develop any side effects to the medication, stop it.  If you plan to get pregnant, this medication needs to be stopped. 4.  Return to clinic in 4-6 months

## 2015-08-13 ENCOUNTER — Ambulatory Visit: Payer: BC Managed Care – PPO | Admitting: Neurology

## 2015-08-13 ENCOUNTER — Telehealth: Payer: Self-pay | Admitting: Neurology

## 2015-08-13 NOTE — Telephone Encounter (Signed)
Pt called concerning side effects of med/Trokendixr// [phone]

## 2015-08-14 NOTE — Telephone Encounter (Signed)
Patient was having some mild side effects but will continue on the medication.

## 2015-09-21 ENCOUNTER — Other Ambulatory Visit: Payer: Self-pay | Admitting: Family Medicine

## 2015-09-21 ENCOUNTER — Ambulatory Visit
Admission: RE | Admit: 2015-09-21 | Discharge: 2015-09-21 | Disposition: A | Discharge status: Home / Self Care | Payer: BC Managed Care – PPO | Source: Ambulatory Visit | Attending: Family Medicine | Admitting: Family Medicine

## 2015-09-21 DIAGNOSIS — M545 Low back pain: Secondary | ICD-10-CM

## 2015-10-06 ENCOUNTER — Encounter (HOSPITAL_COMMUNITY): Payer: Self-pay | Admitting: *Deleted

## 2015-10-06 ENCOUNTER — Observation Stay (HOSPITAL_COMMUNITY)
Admission: EM | Admit: 2015-10-06 | Discharge: 2015-10-07 | Disposition: A | Discharge status: Home / Self Care | Payer: BC Managed Care – PPO | Attending: Surgery | Admitting: Surgery

## 2015-10-06 ENCOUNTER — Encounter (HOSPITAL_COMMUNITY)
Admission: EM | Disposition: A | Discharge status: Home / Self Care | Payer: Self-pay | Source: Home / Self Care | Attending: Physician Assistant

## 2015-10-06 ENCOUNTER — Observation Stay (HOSPITAL_COMMUNITY): Payer: BC Managed Care – PPO | Admitting: Anesthesiology

## 2015-10-06 ENCOUNTER — Emergency Department (HOSPITAL_COMMUNITY): Payer: BC Managed Care – PPO

## 2015-10-06 DIAGNOSIS — Z9114 Patient's other noncompliance with medication regimen: Secondary | ICD-10-CM | POA: Diagnosis not present

## 2015-10-06 DIAGNOSIS — K353 Acute appendicitis with localized peritonitis: Principal | ICD-10-CM | POA: Insufficient documentation

## 2015-10-06 DIAGNOSIS — K2970 Gastritis, unspecified, without bleeding: Secondary | ICD-10-CM | POA: Diagnosis not present

## 2015-10-06 DIAGNOSIS — I498 Other specified cardiac arrhythmias: Secondary | ICD-10-CM | POA: Diagnosis not present

## 2015-10-06 DIAGNOSIS — F909 Attention-deficit hyperactivity disorder, unspecified type: Secondary | ICD-10-CM | POA: Diagnosis present

## 2015-10-06 DIAGNOSIS — K3580 Unspecified acute appendicitis: Secondary | ICD-10-CM | POA: Diagnosis present

## 2015-10-06 DIAGNOSIS — Z79899 Other long term (current) drug therapy: Secondary | ICD-10-CM | POA: Diagnosis not present

## 2015-10-06 DIAGNOSIS — F419 Anxiety disorder, unspecified: Secondary | ICD-10-CM | POA: Diagnosis present

## 2015-10-06 HISTORY — PX: LAPAROSCOPIC APPENDECTOMY: SHX408

## 2015-10-06 LAB — CBC
HEMATOCRIT: 40 % (ref 36.0–46.0)
Hemoglobin: 13.3 g/dL (ref 12.0–15.0)
MCH: 30.4 pg (ref 26.0–34.0)
MCHC: 33.3 g/dL (ref 30.0–36.0)
MCV: 91.5 fL (ref 78.0–100.0)
PLATELETS: 321 10*3/uL (ref 150–400)
RBC: 4.37 MIL/uL (ref 3.87–5.11)
RDW: 12.1 % (ref 11.5–15.5)
WBC: 18.6 10*3/uL — AB (ref 4.0–10.5)

## 2015-10-06 LAB — COMPREHENSIVE METABOLIC PANEL
ALK PHOS: 62 U/L (ref 38–126)
ALT: 19 U/L (ref 14–54)
AST: 15 U/L (ref 15–41)
Albumin: 4.1 g/dL (ref 3.5–5.0)
Anion gap: 11 (ref 5–15)
BILIRUBIN TOTAL: 1 mg/dL (ref 0.3–1.2)
BUN: 9 mg/dL (ref 6–20)
CALCIUM: 9.3 mg/dL (ref 8.9–10.3)
CO2: 25 mmol/L (ref 22–32)
CREATININE: 0.61 mg/dL (ref 0.44–1.00)
Chloride: 100 mmol/L — ABNORMAL LOW (ref 101–111)
GFR calc Af Amer: >60 mL/min (ref >60)
GLUCOSE: 107 mg/dL — AB (ref 65–99)
POTASSIUM: 4 mmol/L (ref 3.5–5.1)
Sodium: 136 mmol/L (ref 135–145)
TOTAL PROTEIN: 7.6 g/dL (ref 6.5–8.1)

## 2015-10-06 LAB — LIPASE, BLOOD: Lipase: 24 U/L (ref 11–51)

## 2015-10-06 LAB — URINALYSIS, ROUTINE W REFLEX MICROSCOPIC
BILIRUBIN URINE: NEGATIVE
Glucose, UA: NEGATIVE mg/dL
Hgb urine dipstick: NEGATIVE
LEUKOCYTES UA: NEGATIVE
NITRITE: NEGATIVE
PROTEIN: NEGATIVE mg/dL
Specific Gravity, Urine: 1.022 (ref 1.005–1.030)
pH: 6 (ref 5.0–8.0)

## 2015-10-06 LAB — HCG, QUANTITATIVE, PREGNANCY: hCG, Beta Chain, Quant, S: <1 m[IU]/mL (ref <5)

## 2015-10-06 SURGERY — APPENDECTOMY, LAPAROSCOPIC
Anesthesia: General | Site: Abdomen

## 2015-10-06 MED ORDER — HYDROMORPHONE HCL 1 MG/ML IJ SOLN: 0.2500 mg | INTRAMUSCULAR | Status: DC | PRN

## 2015-10-06 MED ORDER — ONDANSETRON HCL 4 MG/2ML IJ SOLN: 4.0000 mg | Freq: Four times a day (QID) | INTRAMUSCULAR | Status: DC | PRN

## 2015-10-06 MED ORDER — GI COCKTAIL ~~LOC~~
30.0000 mL | Freq: Once | ORAL | Status: AC
  Administered 2015-10-06: 30 mL via ORAL
  Filled 2015-10-06: qty 30

## 2015-10-06 MED ORDER — SODIUM CHLORIDE 0.9 % IV SOLN: 250.0000 mL | INTRAVENOUS | Status: DC | PRN

## 2015-10-06 MED ORDER — FENTANYL CITRATE (PF) 100 MCG/2ML IJ SOLN
50.0000 ug | INTRAMUSCULAR | Status: DC | PRN
  Administered 2015-10-06 (×2): 50 ug via INTRAVENOUS

## 2015-10-06 MED ORDER — PANTOPRAZOLE SODIUM 40 MG IV SOLR
40.0000 mg | INTRAVENOUS | Status: AC
  Administered 2015-10-06: 40 mg via INTRAVENOUS
  Filled 2015-10-06: qty 40

## 2015-10-06 MED ORDER — METRONIDAZOLE IN NACL 5-0.79 MG/ML-% IV SOLN
500.0000 mg | Freq: Three times a day (TID) | INTRAVENOUS | Status: DC
  Administered 2015-10-06 – 2015-10-07 (×4): 500 mg via INTRAVENOUS
  Filled 2015-10-06 (×4): qty 100

## 2015-10-06 MED ORDER — LACTATED RINGERS IV SOLN
INTRAVENOUS | Status: DC
  Administered 2015-10-06: 17:00:00 via INTRAVENOUS
  Administered 2015-10-06: 1000 mL via INTRAVENOUS

## 2015-10-06 MED ORDER — IOHEXOL 300 MG/ML  SOLN
100.0000 mL | Freq: Once | INTRAMUSCULAR | Status: AC | PRN
  Administered 2015-10-06: 100 mL via INTRAVENOUS

## 2015-10-06 MED ORDER — PHENOL 1.4 % MT LIQD
2.0000 | OROMUCOSAL | Status: DC | PRN
  Administered 2015-10-06: 2 via OROMUCOSAL
  Filled 2015-10-06: qty 177

## 2015-10-06 MED ORDER — KETOROLAC TROMETHAMINE 30 MG/ML IJ SOLN
INTRAMUSCULAR | Status: AC
  Filled 2015-10-06: qty 1

## 2015-10-06 MED ORDER — ONDANSETRON HCL 4 MG/2ML IJ SOLN
INTRAMUSCULAR | Status: AC
  Filled 2015-10-06: qty 2

## 2015-10-06 MED ORDER — CIPROFLOXACIN HCL 500 MG PO TABS: 500.0000 mg | ORAL_TABLET | Freq: Two times a day (BID) | ORAL | Status: AC

## 2015-10-06 MED ORDER — LACTATED RINGERS IV BOLUS (SEPSIS): 1000.0000 mL | Freq: Three times a day (TID) | INTRAVENOUS | Status: DC | PRN

## 2015-10-06 MED ORDER — ONDANSETRON HCL 4 MG/2ML IJ SOLN
4.0000 mg | Freq: Once | INTRAMUSCULAR | Status: AC
  Administered 2015-10-06: 4 mg via INTRAVENOUS
  Filled 2015-10-06: qty 2

## 2015-10-06 MED ORDER — MORPHINE SULFATE (PF) 4 MG/ML IV SOLN
4.0000 mg | Freq: Once | INTRAVENOUS | Status: AC
  Administered 2015-10-06: 4 mg via INTRAVENOUS
  Filled 2015-10-06: qty 1

## 2015-10-06 MED ORDER — GLYCOPYRROLATE 0.2 MG/ML IJ SOLN
INTRAMUSCULAR | Status: AC
  Filled 2015-10-06: qty 4

## 2015-10-06 MED ORDER — DEXAMETHASONE SODIUM PHOSPHATE 10 MG/ML IJ SOLN
INTRAMUSCULAR | Status: AC
  Filled 2015-10-06: qty 1

## 2015-10-06 MED ORDER — LIDOCAINE HCL (CARDIAC) 20 MG/ML IV SOLN
INTRAVENOUS | Status: AC
  Filled 2015-10-06: qty 5

## 2015-10-06 MED ORDER — OXYCODONE HCL 5 MG PO TABS
5.0000 mg | ORAL_TABLET | ORAL | Status: DC | PRN
  Administered 2015-10-06 – 2015-10-07 (×3): 5 mg via ORAL
  Filled 2015-10-06 (×3): qty 1

## 2015-10-06 MED ORDER — FENTANYL CITRATE (PF) 100 MCG/2ML IJ SOLN
INTRAMUSCULAR | Status: AC
  Filled 2015-10-06: qty 2

## 2015-10-06 MED ORDER — SACCHAROMYCES BOULARDII 250 MG PO CAPS
250.0000 mg | ORAL_CAPSULE | Freq: Two times a day (BID) | ORAL | Status: DC
  Administered 2015-10-06 – 2015-10-07 (×3): 250 mg via ORAL
  Filled 2015-10-06 (×4): qty 1

## 2015-10-06 MED ORDER — HEPARIN SODIUM (PORCINE) 5000 UNIT/ML IJ SOLN
5000.0000 [IU] | Freq: Three times a day (TID) | INTRAMUSCULAR | Status: DC
  Administered 2015-10-07: 5000 [IU] via SUBCUTANEOUS
  Filled 2015-10-06 (×2): qty 1

## 2015-10-06 MED ORDER — BUPIVACAINE-EPINEPHRINE 0.25% -1:200000 IJ SOLN
INTRAMUSCULAR | Status: AC
  Filled 2015-10-06: qty 1

## 2015-10-06 MED ORDER — BUPIVACAINE-EPINEPHRINE 0.25% -1:200000 IJ SOLN
INTRAMUSCULAR | Status: DC | PRN
  Administered 2015-10-06: 74 mL

## 2015-10-06 MED ORDER — LACTATED RINGERS IV BOLUS (SEPSIS): 1000.0000 mL | Freq: Once | INTRAVENOUS | Status: DC

## 2015-10-06 MED ORDER — ONDANSETRON HCL 4 MG PO TABS: 4.0000 mg | ORAL_TABLET | Freq: Four times a day (QID) | ORAL | Status: DC | PRN

## 2015-10-06 MED ORDER — ONDANSETRON HCL 4 MG/2ML IJ SOLN
INTRAMUSCULAR | Status: DC | PRN
  Administered 2015-10-06: 4 mg via INTRAVENOUS

## 2015-10-06 MED ORDER — ACETAZOLAMIDE ER 500 MG PO CP12
500.0000 mg | ORAL_CAPSULE | Freq: Two times a day (BID) | ORAL | Status: DC
  Administered 2015-10-06 – 2015-10-07 (×3): 500 mg via ORAL
  Filled 2015-10-06 (×4): qty 1

## 2015-10-06 MED ORDER — ONDANSETRON HCL 4 MG/2ML IJ SOLN: 4.0000 mg | Freq: Once | INTRAMUSCULAR | Status: DC | PRN

## 2015-10-06 MED ORDER — BUPIVACAINE-EPINEPHRINE (PF) 0.25% -1:200000 IJ SOLN
INTRAMUSCULAR | Status: AC
  Filled 2015-10-06: qty 30

## 2015-10-06 MED ORDER — ROCURONIUM BROMIDE 100 MG/10ML IV SOLN
INTRAVENOUS | Status: DC | PRN
  Administered 2015-10-06: 30 mg via INTRAVENOUS

## 2015-10-06 MED ORDER — NEOSTIGMINE METHYLSULFATE 10 MG/10ML IV SOLN
INTRAVENOUS | Status: AC
  Filled 2015-10-06: qty 1

## 2015-10-06 MED ORDER — ROCURONIUM BROMIDE 100 MG/10ML IV SOLN
INTRAVENOUS | Status: AC
  Filled 2015-10-06: qty 1

## 2015-10-06 MED ORDER — OXYCODONE HCL 5 MG PO TABS: 5.0000 mg | ORAL_TABLET | ORAL | Status: AC | PRN

## 2015-10-06 MED ORDER — CIPROFLOXACIN IN D5W 400 MG/200ML IV SOLN
400.0000 mg | Freq: Two times a day (BID) | INTRAVENOUS | Status: DC
  Administered 2015-10-06 – 2015-10-07 (×3): 400 mg via INTRAVENOUS
  Filled 2015-10-06 (×3): qty 200

## 2015-10-06 MED ORDER — ALUM & MAG HYDROXIDE-SIMETH 200-200-20 MG/5ML PO SUSP: 30.0000 mL | Freq: Four times a day (QID) | ORAL | Status: DC | PRN

## 2015-10-06 MED ORDER — SODIUM CHLORIDE 0.9 % IV SOLN
INTRAVENOUS | Status: DC
  Administered 2015-10-06: 75 mL/h via INTRAVENOUS
  Administered 2015-10-06: 1000 mL via INTRAVENOUS

## 2015-10-06 MED ORDER — FENTANYL CITRATE (PF) 250 MCG/5ML IJ SOLN
INTRAMUSCULAR | Status: AC
  Filled 2015-10-06: qty 5

## 2015-10-06 MED ORDER — BISACODYL 10 MG RE SUPP: 10.0000 mg | Freq: Two times a day (BID) | RECTAL | Status: DC | PRN

## 2015-10-06 MED ORDER — PROMETHAZINE HCL 25 MG/ML IJ SOLN
6.2500 mg | INTRAMUSCULAR | Status: DC | PRN
  Filled 2015-10-06: qty 1

## 2015-10-06 MED ORDER — MENTHOL 3 MG MT LOZG
1.0000 | LOZENGE | OROMUCOSAL | Status: DC | PRN
  Administered 2015-10-06: 3 mg via ORAL
  Filled 2015-10-06: qty 9

## 2015-10-06 MED ORDER — GLYCOPYRROLATE 0.2 MG/ML IJ SOLN
INTRAMUSCULAR | Status: DC | PRN
  Administered 2015-10-06: .8 mg via INTRAVENOUS

## 2015-10-06 MED ORDER — LACTATED RINGERS IR SOLN
Status: DC | PRN
  Administered 2015-10-06: 4000 mL

## 2015-10-06 MED ORDER — MAGIC MOUTHWASH
15.0000 mL | Freq: Four times a day (QID) | ORAL | Status: DC | PRN
  Filled 2015-10-06 (×2): qty 15

## 2015-10-06 MED ORDER — SODIUM CHLORIDE 0.9 % IJ SOLN
3.0000 mL | Freq: Two times a day (BID) | INTRAMUSCULAR | Status: DC
  Administered 2015-10-07: 3 mL via INTRAVENOUS

## 2015-10-06 MED ORDER — MEPERIDINE HCL 50 MG/ML IJ SOLN: 6.2500 mg | INTRAMUSCULAR | Status: DC | PRN

## 2015-10-06 MED ORDER — ZOLPIDEM TARTRATE 5 MG PO TABS: 5.0000 mg | ORAL_TABLET | Freq: Every evening | ORAL | Status: DC | PRN

## 2015-10-06 MED ORDER — HYDROMORPHONE HCL 1 MG/ML IJ SOLN
INTRAMUSCULAR | Status: AC
  Filled 2015-10-06: qty 1

## 2015-10-06 MED ORDER — SUCCINYLCHOLINE CHLORIDE 20 MG/ML IJ SOLN
INTRAMUSCULAR | Status: DC | PRN
  Administered 2015-10-06: 100 mg via INTRAVENOUS

## 2015-10-06 MED ORDER — ACETAMINOPHEN 650 MG RE SUPP: 650.0000 mg | Freq: Four times a day (QID) | RECTAL | Status: DC | PRN

## 2015-10-06 MED ORDER — ONDANSETRON 4 MG PO TBDP: 4.0000 mg | ORAL_TABLET | Freq: Four times a day (QID) | ORAL | Status: DC | PRN

## 2015-10-06 MED ORDER — PROPOFOL 10 MG/ML IV BOLUS
INTRAVENOUS | Status: AC
  Filled 2015-10-06: qty 20

## 2015-10-06 MED ORDER — MIDAZOLAM HCL 2 MG/2ML IJ SOLN
INTRAMUSCULAR | Status: AC
  Filled 2015-10-06: qty 2

## 2015-10-06 MED ORDER — METOPROLOL TARTRATE 1 MG/ML IV SOLN: 5.0000 mg | Freq: Four times a day (QID) | INTRAVENOUS | Status: DC | PRN

## 2015-10-06 MED ORDER — DIPHENHYDRAMINE HCL 50 MG/ML IJ SOLN: 12.5000 mg | Freq: Four times a day (QID) | INTRAMUSCULAR | Status: DC | PRN

## 2015-10-06 MED ORDER — MIDAZOLAM HCL 5 MG/5ML IJ SOLN
INTRAMUSCULAR | Status: DC | PRN
  Administered 2015-10-06: 2 mg via INTRAVENOUS

## 2015-10-06 MED ORDER — PANTOPRAZOLE SODIUM 40 MG PO TBEC
40.0000 mg | DELAYED_RELEASE_TABLET | Freq: Two times a day (BID) | ORAL | Status: DC
  Administered 2015-10-06 – 2015-10-07 (×3): 40 mg via ORAL
  Filled 2015-10-06 (×3): qty 1

## 2015-10-06 MED ORDER — DEXAMETHASONE SODIUM PHOSPHATE 10 MG/ML IJ SOLN
INTRAMUSCULAR | Status: DC | PRN
  Administered 2015-10-06: 10 mg via INTRAVENOUS

## 2015-10-06 MED ORDER — KETOROLAC TROMETHAMINE 30 MG/ML IJ SOLN
INTRAMUSCULAR | Status: DC | PRN
  Administered 2015-10-06: 30 mg via INTRAVENOUS

## 2015-10-06 MED ORDER — HYDROMORPHONE HCL 1 MG/ML IJ SOLN
0.5000 mg | INTRAMUSCULAR | Status: DC | PRN
  Administered 2015-10-06: 1 mg via INTRAVENOUS
  Administered 2015-10-06: 0.5 mg via INTRAVENOUS
  Administered 2015-10-07 (×2): 1 mg via INTRAVENOUS
  Filled 2015-10-06 (×3): qty 1

## 2015-10-06 MED ORDER — SODIUM CHLORIDE 0.9 % IJ SOLN: 3.0000 mL | INTRAMUSCULAR | Status: DC | PRN

## 2015-10-06 MED ORDER — LIDOCAINE HCL (CARDIAC) 20 MG/ML IV SOLN
INTRAVENOUS | Status: DC | PRN
  Administered 2015-10-06: 100 mg via INTRAVENOUS

## 2015-10-06 MED ORDER — NEOSTIGMINE METHYLSULFATE 10 MG/10ML IV SOLN
INTRAVENOUS | Status: DC | PRN
  Administered 2015-10-06: 5 mg via INTRAVENOUS

## 2015-10-06 MED ORDER — CHLORHEXIDINE GLUCONATE 4 % EX LIQD: 1.0000 "application " | Freq: Once | CUTANEOUS | Status: DC

## 2015-10-06 MED ORDER — FENTANYL CITRATE (PF) 100 MCG/2ML IJ SOLN
INTRAMUSCULAR | Status: DC | PRN
  Administered 2015-10-06 (×3): 50 ug via INTRAVENOUS
  Administered 2015-10-06: 100 ug via INTRAVENOUS

## 2015-10-06 MED ORDER — LIP MEDEX EX OINT
1.0000 "application " | TOPICAL_OINTMENT | Freq: Two times a day (BID) | CUTANEOUS | Status: DC
  Administered 2015-10-06 – 2015-10-07 (×2): 1 via TOPICAL
  Filled 2015-10-06: qty 7

## 2015-10-06 MED ORDER — SODIUM CHLORIDE 0.9 % IV BOLUS (SEPSIS)
1000.0000 mL | Freq: Once | INTRAVENOUS | Status: AC
  Administered 2015-10-06: 1000 mL via INTRAVENOUS

## 2015-10-06 MED ORDER — METRONIDAZOLE 500 MG PO TABS: 500.0000 mg | ORAL_TABLET | Freq: Three times a day (TID) | ORAL | Status: AC

## 2015-10-06 MED ORDER — SODIUM CHLORIDE 0.9 % IV SOLN
8.0000 mg | Freq: Four times a day (QID) | INTRAVENOUS | Status: DC | PRN
  Filled 2015-10-06: qty 4

## 2015-10-06 MED ORDER — PROPOFOL 10 MG/ML IV BOLUS
INTRAVENOUS | Status: DC | PRN
  Administered 2015-10-06: 200 mg via INTRAVENOUS

## 2015-10-06 MED ORDER — ACETAMINOPHEN 325 MG PO TABS: 650.0000 mg | ORAL_TABLET | Freq: Four times a day (QID) | ORAL | Status: DC | PRN

## 2015-10-06 SURGICAL SUPPLY — 39 items
APPLIER CLIP 5 13 M/L LIGAMAX5 (MISCELLANEOUS)
APPLIER CLIP ROT 10 11.4 M/L (STAPLE)
CLIP APPLIE 5 13 M/L LIGAMAX5 (MISCELLANEOUS) IMPLANT
CLIP APPLIE ROT 10 11.4 M/L (STAPLE) IMPLANT
COVER SURGICAL LIGHT HANDLE (MISCELLANEOUS) ×3 IMPLANT
CUTTER FLEX LINEAR 45M (STAPLE) ×3 IMPLANT
DECANTER SPIKE VIAL GLASS SM (MISCELLANEOUS) ×3 IMPLANT
DEVICE TROCAR PUNCTURE CLOSURE (ENDOMECHANICALS) ×3 IMPLANT
DRAPE LAPAROSCOPIC ABDOMINAL (DRAPES) ×3 IMPLANT
DRAPE WARM FLUID 44X44 (DRAPE) ×3 IMPLANT
DRSG TEGADERM 2-3/8X2-3/4 SM (GAUZE/BANDAGES/DRESSINGS) ×6 IMPLANT
DRSG TEGADERM 4X4.75 (GAUZE/BANDAGES/DRESSINGS) ×3 IMPLANT
ELECT REM PT RETURN 9FT ADLT (ELECTROSURGICAL) ×3
ELECTRODE REM PT RTRN 9FT ADLT (ELECTROSURGICAL) ×1 IMPLANT
ENDOLOOP SUT PDS II  0 18 (SUTURE)
ENDOLOOP SUT PDS II 0 18 (SUTURE) IMPLANT
GAUZE SPONGE 2X2 8PLY STRL LF (GAUZE/BANDAGES/DRESSINGS) ×1 IMPLANT
GLOVE ECLIPSE 8.0 STRL XLNG CF (GLOVE) ×3 IMPLANT
GLOVE INDICATOR 8.0 STRL GRN (GLOVE) ×3 IMPLANT
GOWN STRL REUS W/TWL XL LVL3 (GOWN DISPOSABLE) ×9 IMPLANT
KIT BASIN OR (CUSTOM PROCEDURE TRAY) ×3 IMPLANT
PEN SKIN MARKING BROAD (MISCELLANEOUS) ×3 IMPLANT
POUCH SPECIMEN RETRIEVAL 10MM (ENDOMECHANICALS) ×3 IMPLANT
RELOAD 45 VASCULAR/THIN (ENDOMECHANICALS) IMPLANT
RELOAD STAPLE TA45 3.5 REG BLU (ENDOMECHANICALS) ×3 IMPLANT
SCISSORS LAP 5X35 DISP (ENDOMECHANICALS) ×3 IMPLANT
SET IRRIG TUBING LAPAROSCOPIC (IRRIGATION / IRRIGATOR) ×3 IMPLANT
SHEARS HARMONIC ACE PLUS 36CM (ENDOMECHANICALS) IMPLANT
SLEEVE XCEL OPT CAN 5 100 (ENDOMECHANICALS) ×3 IMPLANT
SPONGE GAUZE 2X2 STER 10/PKG (GAUZE/BANDAGES/DRESSINGS) ×2
SUT MNCRL AB 4-0 PS2 18 (SUTURE) ×3 IMPLANT
SUT PDS AB 1 CT1 27 (SUTURE) ×3 IMPLANT
SUT SILK 2 0 SH (SUTURE) IMPLANT
TOWEL OR 17X26 10 PK STRL BLUE (TOWEL DISPOSABLE) ×3 IMPLANT
TRAY FOLEY W/METER SILVER 14FR (SET/KITS/TRAYS/PACK) ×3 IMPLANT
TRAY FOLEY W/METER SILVER 16FR (SET/KITS/TRAYS/PACK) ×3 IMPLANT
TRAY LAPAROSCOPIC (CUSTOM PROCEDURE TRAY) ×3 IMPLANT
TROCAR BLADELESS OPT 5 100 (ENDOMECHANICALS) ×3 IMPLANT
TROCAR XCEL 12X100 BLDLESS (ENDOMECHANICALS) ×3 IMPLANT

## 2015-10-06 NOTE — ED Notes (Addendum)
The OR stated that the estimated time of surgery is today at 1500. Patient and mother aware.

## 2015-10-06 NOTE — ED Notes (Signed)
PA at bedside.

## 2015-10-06 NOTE — ED Notes (Signed)
Patient transported to CT 

## 2015-10-06 NOTE — Anesthesia Preprocedure Evaluation (Signed)
Anesthesia Evaluation  Patient identified by MRN, date of birth, ID band Patient awake    Reviewed: Allergy & Precautions, NPO status , Patient's Chart, lab work & pertinent test results  Airway Mallampati: I  TM Distance: >3 FB Neck ROM: Full    Dental   Pulmonary    Pulmonary exam normal        Cardiovascular Normal cardiovascular exam     Neuro/Psych Anxiety Pt has Pseudotumor Cerebri on Diamox    GI/Hepatic   Endo/Other    Renal/GU      Musculoskeletal   Abdominal   Peds  Hematology   Anesthesia Other Findings   Reproductive/Obstetrics                             Anesthesia Physical Anesthesia Plan  ASA: II  Anesthesia Plan: General   Post-op Pain Management:    Induction: Intravenous, Rapid sequence and Cricoid pressure planned  Airway Management Planned: Oral ETT  Additional Equipment:   Intra-op Plan:   Post-operative Plan: Extubation in OR  Informed Consent: I have reviewed the patients History and Physical, chart, labs and discussed the procedure including the risks, benefits and alternatives for the proposed anesthesia with the patient or authorized representative who has indicated his/her understanding and acceptance.     Plan Discussed with: CRNA and Surgeon  Anesthesia Plan Comments:         Anesthesia Quick Evaluation
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
## 2015-10-06 NOTE — Discharge Instructions (Signed)
Your appointment is at 12:00pm, please arrive at least 30 min before your appointment to complete your check in paperwork.  If you are unable to arrive 30 min prior to your appointment time we may have to cancel or reschedule you.  LAPAROSCOPIC SURGERY: POST OP INSTRUCTIONS  1. DIET: Follow a light bland diet the first 24 hours after arrival home, such as soup, liquids, crackers, etc. Be sure to include lots of fluids daily. Avoid fast food or heavy meals as your are more likely to get nauseated. Eat a low fat the next few days after surgery.  2. Take your usually prescribed home medications unless otherwise directed. 3. PAIN CONTROL:  1. Pain is best controlled by a usual combination of three different methods TOGETHER:  1. Ice/Heat 2. Over the counter pain medication 3. Prescription pain medication 2. Most patients will experience some swelling and bruising around the incisions. Ice packs or heating pads (30-60 minutes up to 6 times a day) will help. Use ice for the first few days to help decrease swelling and bruising, then switch to heat to help relax tight/sore spots and speed recovery. Some people prefer to use ice alone, heat alone, alternating between ice & heat. Experiment to what works for you. Swelling and bruising can take several weeks to resolve.  3. It is helpful to take an over-the-counter pain medication regularly for the first few weeks. Choose one of the following that works best for you:  1. Naproxen (Aleve, etc) Two  tabs twice a day 2. Ibuprofen (Advil, etc) Three  tabs four times a day (every meal & bedtime) 3. Acetaminophen (Tylenol, etc) 500-650mg  four times a day (every meal & bedtime) 4. A prescription for pain medication (such as oxycodone, hydrocodone, etc) should be given to you upon discharge. Take your pain medication as prescribed.  1. If you are having problems/concerns with the prescription medicine (does not control pain, nausea, vomiting, rash, itching,  etc), please call us [phone] to see if we need to switch you to a different pain medicine that will work better for you and/or control your side effect better. 2. If you need a refill on your pain medication, please contact your pharmacy. They will contact our office to request authorization. Prescriptions will not be filled after 5 pm or on week-ends. 4. Avoid getting constipated. Between the surgery and the pain medications, it is common to experience some constipation. Increasing fluid intake and taking a fiber supplement (such as Metamucil, Citrucel, FiberCon, MiraLax, etc) 1-2 times a day regularly will usually help prevent this problem from occurring. A mild laxative (prune juice, Milk of Magnesia, MiraLax, etc) should be taken according to package directions if there are no bowel movements after 48 hours.  5. Watch out for diarrhea. If you have many loose bowel movements, simplify your diet to bland foods & liquids for a few days. Stop any stool softeners and decrease your fiber supplement. Switching to mild anti-diarrheal medications (Kayopectate, Pepto Bismol) can help. If this worsens or does not improve, please call us. 6. Wash / shower every day. You may shower over the dressings as they are waterproof. Continue to shower over incision(s) after the dressing is off. 7. Remove your waterproof bandages 5 days after surgery. You may leave the incision open to air. You may replace a dressing/Band-Aid to cover the incision for comfort if you wish.  8. ACTIVITIES as tolerated:  1. You may resume regular (light) daily activities beginning the next day--such as  daily self-care, walking, climbing stairs--gradually increasing activities as tolerated. If you can walk 30 minutes without difficulty, it is safe to try more intense activity such as jogging, treadmill, bicycling, low-impact aerobics, swimming, etc. 2. Save the most intensive and strenuous activity for last such as sit-ups, heavy lifting,  contact sports, etc Refrain from any heavy lifting or straining until you are off narcotics for pain control.  3. DO NOT PUSH THROUGH PAIN. Let pain be your guide: If it hurts to do something, don't do it. Pain is your body warning you to avoid that activity for another week until the pain goes down. 4. You may drive when you are no longer taking prescription pain medication, you can comfortably wear a seatbelt, and you can safely maneuver your car and apply brakes. 5. You may have sexual intercourse when it is comfortable.  9. FOLLOW UP in our office  1. Please call [facility] at [phone] to set up an appointment to see your surgeon in the office for a follow-up appointment approximately 2-3 weeks after your surgery. 2. Make sure that you call for this appointment the day you arrive home to insure a convenient appointment time.      10. IF YOU HAVE DISABILITY OR FAMILY LEAVE FORMS, BRING THEM TO THE               OFFICE FOR PROCESSING.   WHEN TO CALL us [phone]:  1. Poor pain control 2. Reactions / problems with new medications (rash/itching, nausea, etc)  3. Fever over 101.5 F (38.5 C) 4. Inability to urinate 5. Nausea and/or vomiting 6. Worsening swelling or bruising 7. Continued bleeding from incision. 8. Increased pain, redness, or drainage from the incision  The clinic staff is available to answer your questions during regular business hours (8:30am-5pm). Please dont hesitate to call and ask to speak to one of our nurses for clinical concerns.  If you have a medical emergency, go to the nearest emergency room or call 911.  A surgeon from [facility] is always on call at the [facility], [place]  [address], [place], [place] [address] ?  MAIN: [phone] ? TOLL FREE: [phone] ?  FAX [phone]  [email or web address]  Appendicitis Appendicitis is when the appendix is swollen (inflamed). The  inflammation can lead to developing a hole (perforation) and a collection of pus (abscess). CAUSES  There is not always an obvious cause of appendicitis. Sometimes it is caused by an obstruction in the appendix. The obstruction can be caused by:  A small, hard, pea-sized ball of stool (fecalith).  Enlarged lymph glands in the appendix. SYMPTOMS   Pain around your belly button (navel) that moves toward your lower right belly (abdomen). The pain can become more severe and sharp as time passes.  Tenderness in the lower right abdomen. Pain gets worse if you cough or make a sudden movement.  Feeling sick to your stomach (nauseous).  Throwing up (vomiting).  Loss of appetite.  Fever.  Constipation.  Diarrhea.  Generally not feeling well. DIAGNOSIS   Physical exam.  Blood tests.  Urine test.  X-rays or a CT scan may confirm the diagnosis. TREATMENT  Once the diagnosis of appendicitis is made, the most common treatment is to remove the appendix as soon as possible. This procedure is called appendectomy. In an open appendectomy, a cut (incision) is made in the lower right abdomen and the appendix is removed. In a  laparoscopic appendectomy, usually 3 small incisions are made. Long, thin instruments and a camera tube are used to remove the appendix. Most patients go home in 24 to 48 hours after appendectomy. In some situations, the appendix may have already perforated and an abscess may have formed. The abscess may have a "wall" around it as seen on a CT scan. In this case, a drain may be placed into the abscess to remove fluid, and you may be treated with antibiotic medicines that kill germs. The medicine is given through a tube in your vein (IV). Once the abscess has resolved, it may or may not be necessary to have an appendectomy. You may need to stay in the hospital longer than 48 hours.   This information is not intended to replace advice given to you by your health care provider.  Make sure you discuss any questions you have with your health care provider.   Document Released: 10/31/2005 Document Revised: 05/01/2012 Document Reviewed: 03/18/2015 Elsevier Interactive Patient Education 2016 [employer]   GETTING TO GOOD BOWEL HEALTH. Irregular bowel habits such as constipation and diarrhea can lead to many problems over time.  Having one soft bowel movement a day is the most important way to prevent further problems.  The anorectal canal is designed to handle stretching and feces to safely manage our ability to get rid of solid waste (feces, poop, stool) out of our body.  BUT, hard constipated stools can act like ripping concrete bricks and diarrhea can be a burning fire to this very sensitive area of our body, causing inflamed hemorrhoids, anal fissures, increasing risk is perirectal abscesses, abdominal pain/bloating, an making irritable bowel worse.      The goal: ONE SOFT BOWEL MOVEMENT A DAY!  To have soft, regular bowel movements:   Drink plenty of fluids, consider 4-6 tall glasses of water a day.    Take plenty of fiber.  Fiber is the undigested part of plant food that passes into the colon, acting s natures broom to encourage bowel motility and movement.  Fiber can absorb and hold large amounts of water. This results in a larger, bulkier stool, which is soft and easier to pass. Work gradually over several weeks up to 6 servings a day of fiber (25g a day even more if needed) in the form of: o Vegetables -- Root (potatoes, carrots, turnips), leafy green (lettuce, salad greens, celery, spinach), or cooked high residue (cabbage, broccoli, etc) o Fruit -- Fresh (unpeeled skin & pulp), Dried (prunes, apricots, cherries, etc ),  or stewed ( applesauce)  o Whole grain breads, pasta, etc (whole wheat)  o Bran cereals   Bulking Agents -- This type of water-retaining fiber generally is easily obtained each day by one of the following:  o Psyllium bran -- The psyllium plant  is remarkable because its ground seeds can retain so much water. This product is available as Metamucil, Konsyl, Effersyllium, Per Diem Fiber, or the less expensive generic preparation in drug and health food stores. Although labeled a laxative, it really is not a laxative.  o Methylcellulose -- This is another fiber derived from wood which also retains water. It is available as Citrucel. o Polyethylene Glycol - and artificial fiber commonly called Miralax or Glycolax.  It is helpful for people with gassy or bloated feelings with regular fiber o Flax Seed - a less gassy fiber than psyllium  No reading or other relaxing activity while on the toilet. If bowel movements take longer than 5  minutes, you are too constipated  AVOID CONSTIPATION.  High fiber and water intake usually takes care of this.  Sometimes a laxative is needed to stimulate more frequent bowel movements, but   Laxatives are not a good long-term solution as it can wear the colon out.  They can help jump-start bowels if constipated, but should be relied on constantly without discussing with your doctor o Osmotics (Milk of Magnesia, Fleets phosphosoda, Magnesium citrate, MiraLax, GoLytely) are safer than  o Stimulants (Senokot, Castor Oil, Dulcolax, Ex Lax)    o Avoid taking laxatives for more than 7 days in a row.   IF SEVERELY CONSTIPATED, try a Bowel Retraining Program: o Do not use laxatives.  o Eat a diet high in roughage, such as bran cereals and leafy vegetables.  o Drink six (6) ounces of prune or apricot juice each morning.  o Eat two (2) large servings of stewed fruit each day.  o Take one (1) heaping tablespoon of a psyllium-based bulking agent twice a day. Use sugar-free sweetener when possible to avoid excessive calories.  o Eat a normal breakfast.  o Set aside 15 minutes after breakfast to sit on the toilet, but do not strain to have a bowel movement.  o If you do not have a bowel movement by the third day, use an  enema and repeat the above steps.   Controlling diarrhea o Switch to liquids and simpler foods for a few days to avoid stressing your intestines further. o Avoid dairy products (especially milk & ice cream) for a short time.  The intestines often can lose the ability to digest lactose when stressed. o Avoid foods that cause gassiness or bloating.  Typical foods include beans and other legumes, cabbage, broccoli, and dairy foods.  Every person has some sensitivity to other foods, so listen to our body and avoid those foods that trigger problems for you. o Adding fiber (Citrucel, Metamucil, psyllium, Miralax) gradually can help thicken stools by absorbing excess fluid and retrain the intestines to act more normally.  Slowly increase the dose over a few weeks.  Too much fiber too soon can backfire and cause cramping & bloating. o Probiotics (such as active yogurt, Align, etc) may help repopulate the intestines and colon with normal bacteria and calm down a sensitive digestive tract.  Most studies show it to be of mild help, though, and such products can be costly. o Medicines: - Bismuth subsalicylate (ex. Kayopectate, Pepto Bismol) every 30 minutes for up to 6 doses can help control diarrhea.  Avoid if pregnant. - Loperamide (Immodium) can slow down diarrhea.  Start with two tablets (4mg  total) first and then try one tablet every 6 hours.  Avoid if you are having fevers or severe pain.  If you are not better or start feeling worse, stop all medicines and call your doctor for advice o Call your doctor if you are getting worse or not better.  Sometimes further testing (cultures, endoscopy, X-ray studies, bloodwork, etc) may be needed to help diagnose and treat the cause of the diarrhea.  TROUBLESHOOTING IRREGULAR BOWELS 1) Avoid extremes of bowel movements (no bad constipation/diarrhea) 2) Miralax 17gm mixed in 8oz. water or juice-daily. May use BID as needed.  3) Gas-x,Phazyme, etc. as needed for gas &  bloating.  4) Soft,bland diet. No spicy,greasy,fried foods.  5) Prilosec over-the-counter as needed  6) May hold gluten/wheat products from diet to see if symptoms improve.  7)  May try probiotics (Align, Activa, etc) to  help calm the bowels down 7) If symptoms become worse call back immediately.  Managing Pain  Pain after surgery or related to activity is often due to strain/injury to muscle, tendon, nerves and/or incisions.  This pain is usually short-term and will improve in a few months.   Many people find it helpful to do the following things TOGETHER to help speed the process of healing and to get back to regular activity more quickly:  1. Avoid heavy physical activity at first a. No lifting greater than 20 pounds at first, then increase to lifting as tolerated over the next few weeks b. Do not push through the pain.  Listen to your body and avoid positions and maneuvers than reproduce the pain.  Wait a few days before trying something more intense c. Walking is okay as tolerated, but go slowly and stop when getting sore.  If you can walk 30 minutes without stopping or pain, you can try more intense activity (running, jogging, aerobics, cycling, swimming, treadmill, sex, sports, weightlifting, etc ) d. Remember: If it hurts to do it, then dont do it!  2. Take Anti-inflammatory medication a. Choose ONE of the following over-the-counter medications: i.            Acetaminophen 500mg  tabs (Tylenol) 1-2 pills with every meal and just before bedtime (avoid if you have liver problems) ii.            Naproxen 220mg  tabs (ex. Aleve) 1-2 pills twice a day (avoid if you have kidney, stomach, IBD, or bleeding problems) iii. Ibuprofen 200mg  tabs (ex. Advil, Motrin) 3-4 pills with every meal and just before bedtime (avoid if you have kidney, stomach, IBD, or bleeding problems) b. Take with food/snack around the clock for 1-2 weeks i. This helps the muscle and nerve tissues become less irritable  and calm down faster  3. Use a Heating pad or Ice/Cold Pack a. 4-6 times a day b. May use warm bath/hottub  or showers  4. Try Gentle Massage and/or Stretching  a. at the area of pain many times a day b. stop if you feel pain - do not overdo it  Try these steps together to help you body heal faster and avoid making things get worse.  Doing just one of these things may not be enough.    If you are not getting better after two weeks or are noticing you are getting worse, contact our office for further advice; we may need to re-evaluate you & see what other things we can do to help.

## 2015-10-06 NOTE — ED Provider Notes (Signed)
CSN: [id number]     Arrival date & time 10/06/15  [id number] History   First MD Initiated Contact with Patient 10/06/15 0631     Chief Complaint  Patient presents with  . Abdominal Pain     (Consider location/radiation/quality/duration/timing/severity/associated sxs/prior Treatment) HPI   Patient is a 24 year old female who presents to the emergency department with complaint of abdominal pain, onset 3 PM yesterday afternoon. Patient reports having intermittent upper abdominal pain for the past 2 weeks that she describes as a sharp burning sensation. She notes the pain is worse with eating or drinking. Patient states she was seen by her PCP last week and was prescribed Protonix. She notes she took Protonix twice with relief and then stopped taking the medication. She notes her abdominal pain worsened yesterday resulting her going to walk in clinic. While at the clinic she was told she had elevated WBCs and was prescribed Protonix and Cipro. She notes she was unable to fill the prescriptions yesterday after leaving the clinic. She states the abdominal pain is worse this morning. Endorses nausea. Denies fever, chills, SOB, CP, vomiting, diarrhea, urinary symptoms, blood in urine or stool. vaginal bleeding, vaginal discharge. Last menstrual period one month ago. Denies any prior abdominal surgeries.  Past Medical History  Diagnosis Date  . ADHD (attention deficit hyperactivity disorder)   . Migraines   . Recurrent urticaria     normal CBC,TSH -?  . Anxiety     induced  . Syncope   . Chest pain on exertion 08/29/2012  . 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 POTS (postural orthostatic tachycardia syndrome)   . 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 Diplopia 07/19/2013  . Pseudotumor cerebri    Past Surgical History  Procedure Laterality Date  . Wisdom    . Wisdom tooth extraction    . Tilt table study N/A 05/03/2013    Procedure: TILT TABLE STUDY;  Surgeon: Duke SalviaSteven C Klein, MD;  [facility];   Service: Cardiovascular;  Laterality: N/A;   Family History  Problem Relation Age of Onset  . Cancer Paternal Grandmother     uterine  . Hypertension Mother   . Cancer Other     brain  . Cancer Other     throat  . Stroke Maternal Uncle    Social History  Substance Use Topics  . Smoking status: Never Smoker   . Smokeless tobacco: Never Used  . Alcohol Use: 0.0 oz/week    0 Standard drinks or equivalent per week     Comment: Occasionally   OB History    Gravida Para Term Preterm AB TAB SAB Ectopic Multiple Living   0              Review of Systems  Gastrointestinal: Positive for nausea and abdominal pain.  All other systems reviewed and are negative.     Allergies  Vyvanse; Lo loestrin fe; Mold extract; Penicillins; and Tamiflu  Home Medications   Prior to Admission medications   Medication Sig Start Date End Date Taking? Authorizing Provider  acetaZOLAMIDE (DIAMOX) 500 MG capsule Take 1 capsule (500 mg total) by mouth 2 (two) times daily. 02/10/15  Yes Donika K Patel, DO  pantoprazole (PROTONIX) 40 MG tablet Take 40 mg by mouth daily.  08/03/15  Yes Historical Provider, MD  Topiramate ER (TROKENDI XR) 50 MG CP24 Take 1 capsule by mouth daily. Patient not taking: Reported on 10/06/2015 08/07/15   [place] Chardonika K Patel, DO  Topiramate ER 25 MG CP24 Take 1 capsule  by mouth daily. Patient not taking: Reported on 10/06/2015 08/07/15   Donika K Patel, DO   BP 131/89 mmHg  Pulse 68  Temp(Src) 98.3 F (36.8 C) (Oral)  Resp 18  Ht  (1.6 m)  Wt 73.936 kg  BMI 28.88 kg/m2  SpO2 100%  LMP 09/07/2015 Physical Exam  Constitutional: She is oriented to person, place, and time. She appears well-developed and well-nourished. No distress.  HENT:  Head: Normocephalic and atraumatic.  Mouth/Throat: Oropharynx is clear and moist. No oropharyngeal exudate.  Eyes: Conjunctivae and EOM are normal. Right eye exhibits no discharge. Left eye exhibits no discharge. No scleral icterus.   Neck: Normal range of motion. Neck supple.  Cardiovascular: Normal rate, regular rhythm, normal heart sounds and intact distal pulses.   Pulmonary/Chest: Effort normal and breath sounds normal. No respiratory distress.  Abdominal: Soft. Bowel sounds are normal. She exhibits no distension and no mass. There is tenderness in the epigastric area and left upper quadrant. There is no rigidity, no rebound and no guarding.  Musculoskeletal: Normal range of motion. She exhibits no edema.  Lymphadenopathy:    She has no cervical adenopathy.  Neurological: She is alert and oriented to person, place, and time.  Skin: Skin is warm and dry.  Nursing note and vitals reviewed.   ED Course  Procedures (including critical care time) Labs Review Labs Reviewed  COMPREHENSIVE METABOLIC PANEL - Abnormal; Notable for the following:    Chloride 100 (*)    Glucose, Bld 107 (*)    All other components within normal limits  CBC - Abnormal; Notable for the following:    WBC 18.6 (*)    All other components within normal limits  URINALYSIS, ROUTINE W REFLEX MICROSCOPIC (NOT AT [facility]) - Abnormal; Notable for the following:    APPearance CLOUDY (*)    Ketones, ur >80 (*)    All other components within normal limits  LIPASE, BLOOD  HCG, QUANTITATIVE, PREGNANCY    Imaging Review Ct Abdomen Pelvis W Contrast  10/06/2015  CLINICAL DATA:  Pain and nausea for several days EXAM: CT ABDOMEN AND PELVIS WITH CONTRAST TECHNIQUE: Multidetector CT imaging of the abdomen and pelvis was performed using the standard protocol following bolus administration of intravenous contrast. Oral contrast was also administered. CONTRAST:  OMNIPAQUE IOHEXOL 300 MG/ML  SOLN COMPARISON:  Abdominal ultrasound November 29, 2007 FINDINGS: Lower chest:  Lung bases are clear. Hepatobiliary: There is fatty infiltration in the region of the fissure for the ligamentum teres. Liver otherwise appears unremarkable. Gallbladder wall is not  appreciably thickened. There is no biliary duct dilatation. Pancreas: No pancreatic mass or inflammatory focus. Spleen: No splenic lesions identified. Adrenals/Urinary Tract: Adrenals appear normal bilaterally. Kidneys bilaterally show no mass or hydronephrosis on either side. There is no renal or ureteral calculus on either side. Urinary bladder is midline with wall thickness within normal limits for degree of bladder distention. Stomach/Bowel: There is mild wall thickening in the region of the gastric antrum without ulceration or mucosal irregularity by CT. No other bowel wall thickening is apparent. No obstruction. No free air or portal venous air. No mesenteric thickening. Vascular/Lymphatic: There is no abdominal aortic aneurysm. No vascular lesions are appreciable. There is no adenopathy in the abdomen or pelvis by size criteria. Note that there are several lymph nodes adjacent to the appendix in the right lower quadrant. Reproductive: Uterus is anteverted. There is no pelvic mass. A small amount of free fluid is noted in the cul-de-sac region.  Other: There is an appendicolith in the appendix. The appendix is distended to 13 mm with slight enhancement of the appendiceal wall. Several small lymph nodes are noted adjacent to the appendix. Musculoskeletal: There are no blastic or lytic bone lesions. No intramuscular or abdominal wall lesions are appreciable. IMPRESSION: Evidence of acute appendiceal inflammation. Small lymph nodes in the region the appendix are likely due to inflammatory etiology. No renal or ureteral calculus.  No hydronephrosis.  No abscess. Small amount of free fluid in the cul-de-sac. Question recent ovarian cyst rupture. Evidence of fatty infiltration near the fissure for the ligamentum teres. Mild wall thickening in the region of the gastric antrum. Question antral gastritis. Critical Value/emergent results were called by telephone at the time of interpretation on 10/06/2015 at 9:03 am to  [facility] , who verbally acknowledged these results. Electronically Signed   By: Bretta Bang III M.D.   On: 10/06/2015 09:04   I have personally reviewed and evaluated these images and lab results as part of my medical decision-making.  Filed Vitals:   10/06/15 0619 10/06/15 0819  BP: 130/79 131/89  Pulse: 74 68  Temp: 98.3 F (36.8 C)   Resp: 18 18     MDM   Final diagnoses:  Acute appendicitis, unspecified acute appendicitis type    Patient presents with intermittent upper abdominal pain with associated nausea. She reports she has had the pain for the past 2 weeks. She saw her PCP over week ago and was given Protonix. She reports taking Protonix twice with relief but stopped taking the medication. She was seen at a walk-in clinic yesterday, told her white count was elevated and was given Protonix and Cipro. She was unable to fill the prescription due to pharmacy being closed. Patient reports worsening pain this morning. VSS. Exam revealed mild epigastric and left upper quadrant pain, no peritoneal signs. Patient given IV fluids, Zofran and pain meds. WBC 18.6, remaining labs unremarkable. Patient reports nausea has improved and abdominal pain is unchanged and now radiates down to her umbilical region. CT abdomen ordered.   CT revealed acute appendicitis. Consulted general surgery who state they will come down and see the pt in the ED. Discussed results and plan with pt. Orders placed for NPO.      Satira Sark [place], [place] 10/06/15 0919  Courteney Randall An, MD 10/06/15 [phone]

## 2015-10-06 NOTE — ED Notes (Signed)
Surgeon at bedside.  

## 2015-10-06 NOTE — Interval H&P Note (Signed)
History and Physical Interval Note:  10/06/2015 3:14 PM  Alexandra Schwartz  has presented today for surgery, with the diagnosis of appendicitis  The various methods of treatment have been discussed with the patient and family. After consideration of risks, benefits and other options for treatment, the patient has consented to  Procedure(s): APPENDECTOMY LAPAROSCOPIC (N/A) as a surgical intervention .  The patient's history has been reviewed, patient examined, no change in status, stable for surgery.  I have reviewed the patient's chart and labs.  Questions were answered to the patient's satisfaction.     Akiva Brassfield C.

## 2015-10-06 NOTE — Anesthesia Postprocedure Evaluation (Signed)
Anesthesia Post Note  Patient: Alexandra Schwartz  Procedure(s) Performed: Procedure(s) (LRB): LAPAROSCOPIC EXPLORATION OF ABDOMEN WITH REMOVAL OF APPENDEX (N/A)  Patient location during evaluation: PACU Anesthesia Type: General Level of consciousness: awake and alert Pain management: pain level controlled Vital Signs Assessment: post-procedure vital signs reviewed and stable Respiratory status: spontaneous breathing, nonlabored ventilation, respiratory function stable and patient connected to nasal cannula oxygen Cardiovascular status: blood pressure returned to baseline and stable Postop Assessment: No signs of nausea or vomiting Anesthetic complications: no    Last Vitals:  Filed Vitals:   10/06/15 1730 10/06/15 1800  BP: 146/79 138/76  Pulse: 95 82  Temp: 37.1 C 37.7 C  Resp: 13 12    Last Pain:  Filed Vitals:   10/06/15 1806  PainSc: 3                  Bekah Igoe DAVID

## 2015-10-06 NOTE — Op Note (Addendum)
5:04 PM  PATIENT:  Alexandra Schwartz  24 y.o. female  Patient Care Team: Mila Palmer, MD as PCP - General (Family Medicine) Verne Carrow, MD as Consulting Physician (Ophthalmology)  PRE-OPERATIVE DIAGNOSIS:  appendicitis  POST-OPERATIVE DIAGNOSIS:  appendicitis  PROCEDURE:   LAPAROSCOPIC EXPLORATION OF ABDOMEN WITH REMOVAL OF APPENDIX  SURGEON:  Surgeon(s): Karie Soda, MD  ANESTHESIA:   local and general  EBL:  Total I/O In: 1000 [I.V.:1000] Out: 300 [Urine:275; Blood:25]  Delay start of Pharmacological VTE agent (>24hrs) due to surgical blood loss or risk of bleeding:  no  DRAINS: none   SPECIMEN:  Source of Specimen:   APPENDIX  DISPOSITION OF SPECIMEN:  PATHOLOGY  COUNTS:  YES  PLAN OF CARE: Admit for overnight observation  PATIENT DISPOSITION:  PACU - hemodynamically stable.   INDICATIONS: Patient with concerning symptoms & work up suspicious for appendicitis.  Surgery was recommended:  The anatomy & physiology of the digestive tract was discussed.  The pathophysiology of appendicitis was discussed.  Natural history risks without surgery was discussed.   I feel the risks of no intervention will lead to serious problems that outweigh the operative risks; therefore, I recommended diagnostic laparoscopy with removal of appendix to remove the pathology.  Laparoscopic & open techniques were discussed.   I noted a good likelihood this will help address the problem.    Risks such as bleeding, infection, abscess, leak, reoperation, possible ostomy, hernia, heart attack, death, and other risks were discussed.  Goals of post-operative recovery were discussed as well.  We will work to minimize complications.  Questions were answered.  The patient expresses understanding & wishes to proceed with surgery.  OR FINDINGS: Inflamed thickened appendix with suppuration.  Strong suggested of acute separative appendicitis.  No definite perforation.  Moderate peritonitis in the lower  abdomen and pelvis  DESCRIPTION:   The patient was identified & brought into the operating room. The patient was positioned supine with arms tucked. SCDs were active during the entire case. The patient underwent general anesthesia without any difficulty.  The abdomen was prepped and draped in a sterile fashion. A Surgical Timeout confirmed our plan.  I made a transverse incision through the superior umbilical fold.  I made a small transverse nick through the infraumbilical fascia and confirmed peritoneal entry.  I placed a 5mm port under optical entry with towel clamp countertraction.  We induced carbon dioxide insufflation.  Camera inspection revealed no injury.  I placed additional ports under direct laparoscopic visualization.  I mobilized the terminal ileum to proximal ascending colon in a lateral to medial fashion.  I took care to avoid injuring any retroperitoneal structures.  I freed the appendix off its attachments to the ascending colon and cecal mesentery.  I elevated the appendix. I skeletonized the mesoappendix. I was able to free off the base of the appendix which was still viable.  I stapled the appendix off the cecum using a laparoscopic stapler. I took a healthy cuff of viable cecum. I ligated the mesoappendix and assured hemostasis in the mesentery.  I placed the appendix inside an EndoCatch bag and removed out the 12 mm port.  I did copious irrigation. Hemostasis was good in the mesoappendix, colon mesentery, and retroperitoneum. Staple line was intact on the cecum with no bleeding. I washed out the pelvis, retrohepatic space and right paracolic gutter. I washed out the left side as well.  Hemostasis is good. There was no perforation or injury. Because the area cleaned up well  after irrigation, I did not place a drain.  I aspirated the carbon dioxide. I removed the ports. I closed the 12 mm fascia site using a 0 Vicryl stitch. I closed skin using 4-0 monocryl stitch.  Patient was  extubated and sent to the recovery room.  I discussed the operative findings with the patient's family. I suspect the patient is going used in the hospital at least overnight and will need antibiotics for 5 days. Questions answered. They expressed understanding and appreciation.  Ardeth SportsmanSteven C. Hollyn Stucky, M.D., F.A.C.S. Gastrointestinal and Minimally Invasive Surgery [facility] [address], [address] [place], [place] [id number] [phone] Main / Paging

## 2015-10-06 NOTE — Anesthesia Procedure Notes (Signed)
Procedure Name: Intubation Date/Time: 10/06/2015 4:01 PM Performed by: Junious SilkGILBERT, Tajana Crotteau Pre-anesthesia Checklist: Patient identified, Emergency Drugs available, Suction available, Patient being monitored and Timeout performed Patient Re-evaluated:Patient Re-evaluated prior to inductionOxygen Delivery Method: Circle system utilized Preoxygenation: Pre-oxygenation with 100% oxygen Intubation Type: IV induction Laryngoscope Size: Miller and 2 Grade View: Grade I Tube type: Oral Tube size: 7.0 mm Number of attempts: 1 Airway Equipment and Method: Stylet Placement Confirmation: ETT inserted through vocal cords under direct vision,  positive ETCO2,  CO2 detector and breath sounds checked- equal and bilateral Secured at: 22 cm Tube secured with: Tape Dental Injury: Teeth and Oropharynx as per pre-operative assessment

## 2015-10-06 NOTE — Transfer of Care (Signed)
Immediate Anesthesia Transfer of Care Note  Patient: Alexandra Schwartz  Procedure(s) Performed: Procedure(s): LAPAROSCOPIC EXPLORATION OF ABDOMEN WITH REMOVAL OF APPENDEX (N/A)  Patient Location: PACU  Anesthesia Type:General  Level of Consciousness: awake, alert  and oriented  Airway & Oxygen Therapy: Patient Spontanous Breathing and Patient connected to face mask oxygen  Post-op Assessment: Report given to RN and Post -op Vital signs reviewed and stable  Post vital signs: Reviewed and stable  Last Vitals:  Filed Vitals:   10/06/15 1325 10/06/15 1330  BP:  128/84  Pulse: 60 67  Temp:    Resp:      Complications: No apparent anesthesia complications

## 2015-10-06 NOTE — ED Notes (Signed)
OR called and stated was on the way transport pt to their department.

## 2015-10-06 NOTE — ED Notes (Signed)
PT states that she has been nauseous for several day; pt denies vomiting; pt states that around 3pm yesterday she began to have intense abd pain; pt states that the pain is to mid abd radiating across both sides; pt c/o tenderness upon palpation; pt states that drinking water causing burning; pt was seen at the walk in clinic and was prescribed Protonix and Cipro due to elevated WBC's; pt states that she has taken the Protonix without relief; pt states that the pain is worse this morning

## 2015-10-07 ENCOUNTER — Encounter (HOSPITAL_COMMUNITY): Payer: Self-pay | Admitting: Surgery

## 2015-10-07 MED ORDER — METHOCARBAMOL 500 MG PO TABS: 1000.0000 mg | ORAL_TABLET | Freq: Three times a day (TID) | ORAL | Status: DC | PRN

## 2015-10-07 NOTE — Progress Notes (Addendum)
Gauze dsg covered with tegaderm to LLQ is saturated with blood to where it has leaked out under tegaderm and stained gown. dsg removed and replaced with another dry gauze and taped down. No active bleeding noted under dsg.

## 2015-10-07 NOTE — Discharge Summary (Signed)
[facility] Discharge Summary   Patient ID: Alexandra Schwartz MRN: [id number] DOB/AGE: 14-Sep-1991 24 y.o.  Admit date: 10/06/2015 Discharge date: 10/07/2015  Admitting Diagnosis: Acute appendicitis  Discharge Diagnosis Patient Active Problem List   Diagnosis Date Noted  . History of medication noncompliance - financial & psychiatric issues 10/06/2015  . Acute appendicitis s/p lap appendectomy 10/06/2015 10/06/2015  . Gastritis 10/06/2015  . Pseudotumor cerebri 08/09/2013  . 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 ADHD (attention deficit hyperactivity disorder) 11/28/2012  . Anxiety 11/28/2012  . Migraines 11/28/2012  . 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 Chest pain on exertion 08/29/2012    Consultants None  Imaging: Ct Abdomen Pelvis W Contrast  10/06/2015  CLINICAL DATA:  Pain and nausea for several days EXAM: CT ABDOMEN AND PELVIS WITH CONTRAST TECHNIQUE: Multidetector CT imaging of the abdomen and pelvis was performed using the standard protocol following bolus administration of intravenous contrast. Oral contrast was also administered. CONTRAST:  OMNIPAQUE IOHEXOL 300 MG/ML  SOLN COMPARISON:  Abdominal ultrasound November 29, 2007 FINDINGS: Lower chest:  Lung bases are clear. Hepatobiliary: There is fatty infiltration in the region of the fissure for the ligamentum teres. Liver otherwise appears unremarkable. Gallbladder wall is not appreciably thickened. There is no biliary duct dilatation. Pancreas: No pancreatic mass or inflammatory focus. Spleen: No splenic lesions identified. Adrenals/Urinary Tract: Adrenals appear normal bilaterally. Kidneys bilaterally show no mass or hydronephrosis on either side. There is no renal or ureteral calculus on either side. Urinary bladder is midline with wall thickness within normal limits for degree of bladder distention. Stomach/Bowel: There is mild wall thickening in the region of the gastric antrum without ulceration or mucosal  irregularity by CT. No other bowel wall thickening is apparent. No obstruction. No free air or portal venous air. No mesenteric thickening. Vascular/Lymphatic: There is no abdominal aortic aneurysm. No vascular lesions are appreciable. There is no adenopathy in the abdomen or pelvis by size criteria. Note that there are several lymph nodes adjacent to the appendix in the right lower quadrant. Reproductive: Uterus is anteverted. There is no pelvic mass. A small amount of free fluid is noted in the cul-de-sac region. Other: There is an appendicolith in the appendix. The appendix is distended to 13 mm with slight enhancement of the appendiceal wall. Several small lymph nodes are noted adjacent to the appendix. Musculoskeletal: There are no blastic or lytic bone lesions. No intramuscular or abdominal wall lesions are appreciable. IMPRESSION: Evidence of acute appendiceal inflammation. Small lymph nodes in the region the appendix are likely due to inflammatory etiology. No renal or ureteral calculus.  No hydronephrosis.  No abscess. Small amount of free fluid in the cul-de-sac. Question recent ovarian cyst rupture. Evidence of fatty infiltration near the fissure for the ligamentum teres. Mild wall thickening in the region of the gastric antrum. Question antral gastritis. Critical Value/emergent results were called by telephone at the time of interpretation on 10/06/2015 at 9:03 am to [facility] , who verbally acknowledged these results. Electronically Signed   By: Bretta Bang III M.D.   On: 10/06/2015 09:04    Procedures Dr. Michaell Cowing (10/06/15) - Laparoscopic Appendectomy  Hospital Course:  24 y/o female with h/o ADHD and anxiety. Has had some abdominal pain and intermittent for the past 2 months. Went to urgent clinic in September. Suspicion of gastritis or heartburn. Given Protonix. She took for a few days and felt better. She has had some intermittent discomfort again for the past 2 weeks.  Yesterday she  had more intense central abdominal pain. Went again to into a walk-in clinic. She started one dose of Protonix. Felt decreased appetite and nausea but no throwing up. Pain intensified. Came to emergency room. CT concerning for inflamed stomach and appendix. Surgical consultation requested. [id number]. Mother in room  Patient normally has a bowel movement every day or other day. No severe bouts of constipation or diarrhea recently. No personal nor family history of GI/colon cancer, inflammatory bowel disease, irritable bowel syndrome, allergy such as Celiac Sprue, dietary/dairy problems, colitis, ulcers nor gastritis. No recent sick contacts/gastroenteritis. No travel outside the country. No changes in diet. No dysphagia to solids or liquids. No hematochezia, hematemesis, coffee ground emesis. No evidence of prior gastric/peptic ulceration.  Workup showed acute appendicitis.  Patient was admitted and underwent procedure listed above.  Tolerated procedure well and was transferred to the floor.  She was found to have suppurative appendicitis.  Diet was advanced as tolerated.  On POD #1, the patient was voiding well, tolerating diet, ambulating well, pain well controlled, vital signs stable, incisions c/d/i and felt stable for discharge home.  Patient will follow up in our office in 2 weeks and knows to call with questions or concerns.  She will continue at total of 5 days of antibiotics (switch to augmenting at discharge).  She is encouraged to continue her Protonix at discharge to help with her gastritis.    Physical Exam: General:  Alert, NAD, pleasant, comfortable Abd:  Soft, ND, mild tenderness, incisions C/D/I with tegaderm in place    Medication List    STOP taking these medications        Topiramate ER 25 MG Cp24     Topiramate ER 50 MG Cp24  Commonly known as:  TROKENDI XR      TAKE these medications        acetaZOLAMIDE 500 MG capsule  Commonly known as:  DIAMOX   Take 1 capsule (500 mg total) by mouth 2 (two) times daily.     ciprofloxacin 500 MG tablet  Commonly known as:  CIPRO  Take 1 tablet (500 mg total) by mouth 2 (two) times daily.     metroNIDAZOLE 500 MG tablet  Commonly known as:  FLAGYL  Take 1 tablet (500 mg total) by mouth 3 (three) times daily.     oxyCODONE 5 MG immediate release tablet  Commonly known as:  Oxy IR/ROXICODONE  Take 1-2 tablets (5-10 mg total) by mouth every 4 (four) hours as needed for moderate pain, severe pain or breakthrough pain.     pantoprazole 40 MG tablet  Commonly known as:  PROTONIX  Take 40 mg by mouth daily.         Follow-up Information    [facility].   Specialty:  General Surgery   Why:  For post-operation check. Your appointment is at 12:00pm, please arrive at least 30 min before your appointment to complete your check in paperwork.  If you are unable to arrive 30min prior to your appointment time we may have to cancel or reschedule you   Contact information:   [address] [place] [id number] [phone]      Signed: Bradd CanaryMegan N. Devan Danzer, PA-C [facility] [phone]  10/07/2015, 8:15 AM

## 2015-10-07 NOTE — Progress Notes (Signed)
Pt ambulated in hall, made a lap around the unit floor with no apparent signs of distress. Pt urinating. Pt will order regular food for lunch, and will monitor to see if she tolerates it.

## 2015-10-07 NOTE — Progress Notes (Signed)
Discharge instructions reviewed with patient at bedside.  Questions answered.  Discharged via wheelchair.

## 2015-10-07 NOTE — Plan of Care (Signed)
Problem: Pain Managment: Goal: General experience of comfort will improve Outcome: Progressing Medicated twice tonight with 1 mg of dilaudid for abdominal pain and discomfort.

## 2015-10-13 ENCOUNTER — Ambulatory Visit: Payer: BC Managed Care – PPO | Admitting: Rehabilitation

## 2015-10-16 ENCOUNTER — Encounter (HOSPITAL_COMMUNITY): Payer: Self-pay | Admitting: Surgery

## 2015-12-10 ENCOUNTER — Ambulatory Visit: Payer: BC Managed Care – PPO | Admitting: Neurology
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
## 2018-12-17 DIAGNOSIS — F411 Generalized anxiety disorder: Secondary | ICD-10-CM | POA: Diagnosis not present

## 2018-12-27 DIAGNOSIS — F411 Generalized anxiety disorder: Secondary | ICD-10-CM | POA: Diagnosis not present

## 2019-01-25 DIAGNOSIS — F411 Generalized anxiety disorder: Secondary | ICD-10-CM | POA: Diagnosis not present

## 2019-02-03 DIAGNOSIS — F411 Generalized anxiety disorder: Secondary | ICD-10-CM | POA: Diagnosis not present

## 2019-02-08 DIAGNOSIS — F411 Generalized anxiety disorder: Secondary | ICD-10-CM | POA: Diagnosis not present

## 2019-02-15 DIAGNOSIS — F411 Generalized anxiety disorder: Secondary | ICD-10-CM | POA: Diagnosis not present

## 2019-02-22 DIAGNOSIS — F411 Generalized anxiety disorder: Secondary | ICD-10-CM | POA: Diagnosis not present

## 2019-03-01 DIAGNOSIS — F411 Generalized anxiety disorder: Secondary | ICD-10-CM | POA: Diagnosis not present

## 2019-03-06 DIAGNOSIS — F411 Generalized anxiety disorder: Secondary | ICD-10-CM | POA: Diagnosis not present

## 2019-03-13 DIAGNOSIS — F411 Generalized anxiety disorder: Secondary | ICD-10-CM | POA: Diagnosis not present

## 2019-03-20 DIAGNOSIS — F411 Generalized anxiety disorder: Secondary | ICD-10-CM | POA: Diagnosis not present

## 2019-03-27 DIAGNOSIS — F411 Generalized anxiety disorder: Secondary | ICD-10-CM | POA: Diagnosis not present

## 2019-04-24 DIAGNOSIS — F411 Generalized anxiety disorder: Secondary | ICD-10-CM | POA: Diagnosis not present

## 2019-05-01 DIAGNOSIS — F411 Generalized anxiety disorder: Secondary | ICD-10-CM | POA: Diagnosis not present

## 2019-05-15 DIAGNOSIS — F411 Generalized anxiety disorder: Secondary | ICD-10-CM | POA: Diagnosis not present

## 2019-05-29 DIAGNOSIS — F411 Generalized anxiety disorder: Secondary | ICD-10-CM | POA: Diagnosis not present

## 2019-06-02 DIAGNOSIS — Z20828 Contact with and (suspected) exposure to other viral communicable diseases: Secondary | ICD-10-CM | POA: Diagnosis not present

## 2019-06-12 DIAGNOSIS — F411 Generalized anxiety disorder: Secondary | ICD-10-CM | POA: Diagnosis not present

## 2019-08-09 DIAGNOSIS — F411 Generalized anxiety disorder: Secondary | ICD-10-CM | POA: Diagnosis not present

## 2019-08-28 DIAGNOSIS — F411 Generalized anxiety disorder: Secondary | ICD-10-CM | POA: Diagnosis not present

## 2019-09-04 DIAGNOSIS — F411 Generalized anxiety disorder: Secondary | ICD-10-CM | POA: Diagnosis not present

## 2019-09-20 DIAGNOSIS — F411 Generalized anxiety disorder: Secondary | ICD-10-CM | POA: Diagnosis not present

## 2019-10-02 DIAGNOSIS — Z20828 Contact with and (suspected) exposure to other viral communicable diseases: Secondary | ICD-10-CM | POA: Diagnosis not present

## 2019-10-14 DIAGNOSIS — F411 Generalized anxiety disorder: Secondary | ICD-10-CM | POA: Diagnosis not present

## 2019-11-01 DIAGNOSIS — F411 Generalized anxiety disorder: Secondary | ICD-10-CM | POA: Diagnosis not present

## 2019-11-13 DIAGNOSIS — F330 Major depressive disorder, recurrent, mild: Secondary | ICD-10-CM | POA: Diagnosis not present

## 2019-11-13 DIAGNOSIS — F419 Anxiety disorder, unspecified: Secondary | ICD-10-CM | POA: Diagnosis not present

## 2019-11-13 DIAGNOSIS — F410 Panic disorder [episodic paroxysmal anxiety] without agoraphobia: Secondary | ICD-10-CM | POA: Diagnosis not present

## 2019-11-29 DIAGNOSIS — F411 Generalized anxiety disorder: Secondary | ICD-10-CM | POA: Diagnosis not present

## 2019-12-06 DIAGNOSIS — F411 Generalized anxiety disorder: Secondary | ICD-10-CM | POA: Diagnosis not present

## 2019-12-13 DIAGNOSIS — F411 Generalized anxiety disorder: Secondary | ICD-10-CM | POA: Diagnosis not present

## 2019-12-20 DIAGNOSIS — F411 Generalized anxiety disorder: Secondary | ICD-10-CM | POA: Diagnosis not present

## 2020-01-10 DIAGNOSIS — F411 Generalized anxiety disorder: Secondary | ICD-10-CM | POA: Diagnosis not present

## 2020-01-24 DIAGNOSIS — F411 Generalized anxiety disorder: Secondary | ICD-10-CM | POA: Diagnosis not present

## 2020-02-07 DIAGNOSIS — F411 Generalized anxiety disorder: Secondary | ICD-10-CM | POA: Diagnosis not present

## 2020-05-21 DIAGNOSIS — F419 Anxiety disorder, unspecified: Secondary | ICD-10-CM | POA: Diagnosis not present

## 2020-05-21 DIAGNOSIS — F330 Major depressive disorder, recurrent, mild: Secondary | ICD-10-CM | POA: Diagnosis not present

## 2020-05-21 DIAGNOSIS — F410 Panic disorder [episodic paroxysmal anxiety] without agoraphobia: Secondary | ICD-10-CM | POA: Diagnosis not present

## 2020-07-29 DIAGNOSIS — F411 Generalized anxiety disorder: Secondary | ICD-10-CM | POA: Diagnosis not present

## 2020-08-06 DIAGNOSIS — Z01411 Encounter for gynecological examination (general) (routine) with abnormal findings: Secondary | ICD-10-CM | POA: Diagnosis not present

## 2020-08-06 DIAGNOSIS — Z01419 Encounter for gynecological examination (general) (routine) without abnormal findings: Secondary | ICD-10-CM | POA: Diagnosis not present

## 2020-08-06 DIAGNOSIS — N946 Dysmenorrhea, unspecified: Secondary | ICD-10-CM | POA: Diagnosis not present

## 2020-08-07 DIAGNOSIS — F411 Generalized anxiety disorder: Secondary | ICD-10-CM | POA: Diagnosis not present

## 2020-08-14 DIAGNOSIS — F411 Generalized anxiety disorder: Secondary | ICD-10-CM | POA: Diagnosis not present

## 2020-09-07 DIAGNOSIS — F411 Generalized anxiety disorder: Secondary | ICD-10-CM | POA: Diagnosis not present

## 2020-10-02 DIAGNOSIS — N920 Excessive and frequent menstruation with regular cycle: Secondary | ICD-10-CM | POA: Diagnosis not present

## 2020-10-02 DIAGNOSIS — Z01419 Encounter for gynecological examination (general) (routine) without abnormal findings: Secondary | ICD-10-CM | POA: Diagnosis not present

## 2020-10-21 DIAGNOSIS — F411 Generalized anxiety disorder: Secondary | ICD-10-CM | POA: Diagnosis not present

## 2020-10-23 DIAGNOSIS — F411 Generalized anxiety disorder: Secondary | ICD-10-CM | POA: Diagnosis not present

## 2020-10-27 DIAGNOSIS — F411 Generalized anxiety disorder: Secondary | ICD-10-CM | POA: Diagnosis not present

## 2020-11-17 DIAGNOSIS — F411 Generalized anxiety disorder: Secondary | ICD-10-CM | POA: Diagnosis not present

## 2020-12-02 DIAGNOSIS — F411 Generalized anxiety disorder: Secondary | ICD-10-CM | POA: Diagnosis not present

## 2020-12-04 DIAGNOSIS — F419 Anxiety disorder, unspecified: Secondary | ICD-10-CM | POA: Diagnosis not present

## 2020-12-04 DIAGNOSIS — F410 Panic disorder [episodic paroxysmal anxiety] without agoraphobia: Secondary | ICD-10-CM | POA: Diagnosis not present

## 2020-12-04 DIAGNOSIS — F330 Major depressive disorder, recurrent, mild: Secondary | ICD-10-CM | POA: Diagnosis not present

## 2020-12-08 DIAGNOSIS — F411 Generalized anxiety disorder: Secondary | ICD-10-CM | POA: Diagnosis not present

## 2020-12-22 DIAGNOSIS — F411 Generalized anxiety disorder: Secondary | ICD-10-CM | POA: Diagnosis not present

## 2020-12-29 DIAGNOSIS — F411 Generalized anxiety disorder: Secondary | ICD-10-CM | POA: Diagnosis not present

## 2021-01-27 DIAGNOSIS — F410 Panic disorder [episodic paroxysmal anxiety] without agoraphobia: Secondary | ICD-10-CM | POA: Diagnosis not present

## 2021-01-27 DIAGNOSIS — F419 Anxiety disorder, unspecified: Secondary | ICD-10-CM | POA: Diagnosis not present

## 2021-01-27 DIAGNOSIS — F330 Major depressive disorder, recurrent, mild: Secondary | ICD-10-CM | POA: Diagnosis not present

## 2021-02-02 DIAGNOSIS — F411 Generalized anxiety disorder: Secondary | ICD-10-CM | POA: Diagnosis not present

## 2021-02-09 DIAGNOSIS — F411 Generalized anxiety disorder: Secondary | ICD-10-CM | POA: Diagnosis not present

## 2021-03-04 DIAGNOSIS — F411 Generalized anxiety disorder: Secondary | ICD-10-CM | POA: Diagnosis not present

## 2021-03-30 DIAGNOSIS — F411 Generalized anxiety disorder: Secondary | ICD-10-CM | POA: Diagnosis not present

## 2021-04-02 DIAGNOSIS — R0681 Apnea, not elsewhere classified: Secondary | ICD-10-CM | POA: Diagnosis not present

## 2021-04-02 DIAGNOSIS — R059 Cough, unspecified: Secondary | ICD-10-CM | POA: Diagnosis not present

## 2021-04-28 DIAGNOSIS — F419 Anxiety disorder, unspecified: Secondary | ICD-10-CM | POA: Diagnosis not present

## 2021-04-28 DIAGNOSIS — F410 Panic disorder [episodic paroxysmal anxiety] without agoraphobia: Secondary | ICD-10-CM | POA: Diagnosis not present

## 2021-04-28 DIAGNOSIS — F330 Major depressive disorder, recurrent, mild: Secondary | ICD-10-CM | POA: Diagnosis not present

## 2021-05-08 ENCOUNTER — Encounter (HOSPITAL_BASED_OUTPATIENT_CLINIC_OR_DEPARTMENT_OTHER): Payer: Self-pay | Admitting: *Deleted

## 2021-05-08 ENCOUNTER — Emergency Department (HOSPITAL_BASED_OUTPATIENT_CLINIC_OR_DEPARTMENT_OTHER)
Admission: EM | Admit: 2021-05-08 | Discharge: 2021-05-09 | Disposition: A | Discharge status: Home / Self Care | Payer: BC Managed Care – PPO | Attending: Emergency Medicine | Admitting: Emergency Medicine

## 2021-05-08 ENCOUNTER — Emergency Department (HOSPITAL_BASED_OUTPATIENT_CLINIC_OR_DEPARTMENT_OTHER): Payer: BC Managed Care – PPO

## 2021-05-08 DIAGNOSIS — R112 Nausea with vomiting, unspecified: Secondary | ICD-10-CM | POA: Insufficient documentation

## 2021-05-08 DIAGNOSIS — D72829 Elevated white blood cell count, unspecified: Secondary | ICD-10-CM | POA: Insufficient documentation

## 2021-05-08 DIAGNOSIS — R519 Headache, unspecified: Secondary | ICD-10-CM | POA: Insufficient documentation

## 2021-05-08 DIAGNOSIS — R110 Nausea: Secondary | ICD-10-CM

## 2021-05-08 LAB — COMPREHENSIVE METABOLIC PANEL
ALT: 26 U/L (ref 0–44)
AST: 26 U/L (ref 15–41)
Albumin: 4.1 g/dL (ref 3.5–5.0)
Alkaline Phosphatase: 67 U/L (ref 38–126)
Anion gap: 9 (ref 5–15)
BUN: 10 mg/dL (ref 6–20)
CO2: 25 mmol/L (ref 22–32)
Calcium: 9.2 mg/dL (ref 8.9–10.3)
Chloride: 103 mmol/L (ref 98–111)
Creatinine, Ser: 0.69 mg/dL (ref 0.44–1.00)
GFR, Estimated: >60 mL/min (ref >60)
Glucose, Bld: 138 mg/dL — ABNORMAL HIGH (ref 70–99)
Potassium: 4.9 mmol/L (ref 3.5–5.1)
Sodium: 137 mmol/L (ref 135–145)
Total Bilirubin: 0.4 mg/dL (ref 0.3–1.2)
Total Protein: 7.3 g/dL (ref 6.5–8.1)

## 2021-05-08 LAB — CBC
HCT: 42.4 % (ref 36.0–46.0)
Hemoglobin: 13.8 g/dL (ref 12.0–15.0)
MCH: 28.5 pg (ref 26.0–34.0)
MCHC: 32.5 g/dL (ref 30.0–36.0)
MCV: 87.6 fL (ref 80.0–100.0)
Platelets: 167 10*3/uL (ref 150–400)
RBC: 4.84 MIL/uL (ref 3.87–5.11)
RDW: 13.4 % (ref 11.5–15.5)
WBC: 16.8 10*3/uL — ABNORMAL HIGH (ref 4.0–10.5)
nRBC: 0 % (ref 0.0–0.2)

## 2021-05-08 LAB — COOXEMETRY PANEL
Carboxyhemoglobin: 0.8 % (ref 0.5–1.5)
Methemoglobin: 1 % (ref 0.0–1.5)
O2 Saturation: 73.7 %
Total hemoglobin: 13.4 g/dL (ref 12.0–16.0)

## 2021-05-08 LAB — PREGNANCY, URINE: Preg Test, Ur: NEGATIVE

## 2021-05-08 MED ORDER — PROCHLORPERAZINE EDISYLATE 10 MG/2ML IJ SOLN
5.0000 mg | Freq: Once | INTRAMUSCULAR | Status: AC
  Administered 2021-05-08: 5 mg via INTRAVENOUS
  Filled 2021-05-08: qty 2

## 2021-05-08 MED ORDER — ONDANSETRON HCL 4 MG/2ML IJ SOLN
4.0000 mg | Freq: Once | INTRAMUSCULAR | Status: AC
  Administered 2021-05-08: 4 mg via INTRAVENOUS
  Filled 2021-05-08: qty 2

## 2021-05-08 MED ORDER — KETOROLAC TROMETHAMINE 15 MG/ML IJ SOLN
15.0000 mg | Freq: Once | INTRAMUSCULAR | Status: AC
  Administered 2021-05-08: 15 mg via INTRAVENOUS
  Filled 2021-05-08: qty 1

## 2021-05-08 MED ORDER — SODIUM CHLORIDE 0.9 % IV BOLUS
500.0000 mL | Freq: Once | INTRAVENOUS | Status: AC
  Administered 2021-05-08: 500 mL via INTRAVENOUS

## 2021-05-08 NOTE — ED Notes (Addendum)
BIB EMS, driving when emesis x 1 occurred. Called 911, Vomited 2nd time as she walked into triage. 138/84-90-96% RA 18 CBG 90

## 2021-05-08 NOTE — ED Provider Notes (Signed)
[facility] EMERGENCY DEPT Provider Note   CSN: [id number] Arrival date & time: 05/08/21  1610     History Chief Complaint  Patient presents with   Emesis    Alexandra Schwartz is a 30 y.o. female.   Emesis Patient presents after feeling bad.  Was driving her car and began to feel dizzy.  Felt as if she could pass out.  Did not feel her heart going fast or slow.  Then developed nausea and vomiting.  Called 911.  States she developed a headache after she arrived here.  No fevers or chills.  Has a history of headaches.  This is not that different than normal.  Also felt a little anxious.  However after arriving here her mother went to her car went into her car and began to feel bad.  Also felt as if she was short of breath.    Past Medical History:  Diagnosis Date   ADHD (attention deficit hyperactivity disorder)    Anxiety    induced   Chest pain on exertion 08/29/2012   Diplopia 07/19/2013   Migraines    Papilledema 07/19/2013   POTS (postural orthostatic tachycardia syndrome)    Pseudotumor cerebri    Recurrent urticaria    normal CBC,TSH -?   Syncope    Syncope, Neurocardiogenic 08/29/2012    Patient Active Problem List   Diagnosis Date Noted   History of medication noncompliance - financial & psychiatric issues 10/06/2015   Acute appendicitis s/p lap appendectomy 10/06/2015 10/06/2015   Gastritis 10/06/2015   Pseudotumor cerebri 08/09/2013   Papilledema 07/19/2013   ADHD (attention deficit hyperactivity disorder) 11/28/2012   Anxiety 11/28/2012   Migraines 11/28/2012   Syncope, Neurocardiogenic 08/29/2012   Chest pain on exertion 08/29/2012    Past Surgical History:  Procedure Laterality Date   LAPAROSCOPIC APPENDECTOMY N/A 10/06/2015   Procedure: LAPAROSCOPIC EXPLORATION OF ABDOMEN WITH REMOVAL OF APPENDEX;  Surgeon: Karie Soda, MD;  Location: [facility] ORS;  Service: General;  Laterality: N/A;   TILT TABLE STUDY N/A 05/03/2013   Procedure: TILT TABLE  STUDY;  Surgeon: Duke Salvia, MD;  Location: [facility] CATH LAB;  Service: Cardiovascular;  Laterality: N/A;   wisdom     WISDOM TOOTH EXTRACTION       OB History     Gravida  0   Para      Term      Preterm      AB      Living         SAB      IAB      Ectopic      Multiple      Live Births              Family History  Problem Relation Age of Onset   Cancer Paternal Grandmother        uterine   Hypertension Mother    Cancer Other        brain   Cancer Other        throat   Stroke Maternal Uncle     Social History   Tobacco Use   Smoking status: Never   Smokeless tobacco: Never  Substance Use Topics   Alcohol use: Yes    Alcohol/week: 0.0 standard drinks    Comment: Occasionally   Drug use: No    Home Medications Prior to Admission medications   Medication Sig Start Date End Date Taking? Authorizing Provider  ondansetron (ZOFRAN-ODT) 4 MG disintegrating  tablet Take 1 tablet (4 mg total) by mouth every 8 (eight) hours as needed for nausea or vomiting. 05/09/21  Yes Benjiman Core, MD  acetaZOLAMIDE (DIAMOX) 500 MG capsule Take 1 capsule (500 mg total) by mouth 2 (two) times daily. 02/10/15   Nita Sickle K, DO  ciprofloxacin (CIPRO) 500 MG tablet Take 1 tablet (500 mg total) by mouth 2 (two) times daily. 10/06/15   Karie Soda, MD  metroNIDAZOLE (FLAGYL) 500 MG tablet Take 1 tablet (500 mg total) by mouth 3 (three) times daily. 10/06/15   Karie Soda, MD  oxyCODONE (OXY IR/ROXICODONE) 5 MG immediate release tablet Take 1-2 tablets (5-10 mg total) by mouth every 4 (four) hours as needed for moderate pain, severe pain or breakthrough pain. 10/06/15   Karie Soda, MD  pantoprazole (PROTONIX) 40 MG tablet Take 40 mg by mouth daily.  08/03/15   [provider]    Allergies    Vyvanse [lisdexamfetamine dimesylate], Lo loestrin fe [norethin ace-eth estrad-fe], Mold extract [trichophyton], Penicillins, and Tamiflu [oseltamivir  phosphate]  Review of Systems   Review of Systems  Constitutional:  Negative for appetite change.  HENT:  Negative for congestion.   Respiratory:  Positive for shortness of breath.   Cardiovascular:  Positive for chest pain.  Gastrointestinal:  Positive for nausea and vomiting.  Genitourinary:  Negative for flank pain.  Musculoskeletal:  Negative for back pain.  Skin:  Negative for rash.  Neurological:  Positive for dizziness and light-headedness.  Psychiatric/Behavioral:  Negative for confusion.    Physical Exam Updated Vital Signs BP 126/81   Pulse 84   Temp 98.4 F (36.9 C) (Oral)   Resp 15   LMP 04/09/2021   SpO2 98%   Physical Exam Vitals and nursing note reviewed.  HENT:     Head: Atraumatic.  Eyes:     Extraocular Movements: Extraocular movements intact.     Pupils: Pupils are equal, round, and reactive to light.  Cardiovascular:     Rate and Rhythm: Regular rhythm.  Pulmonary:     Breath sounds: No wheezing or rhonchi.  Abdominal:     Tenderness: There is no abdominal tenderness.  Musculoskeletal:        General: No tenderness.     Cervical back: Neck supple.  Skin:    General: Skin is warm.     Capillary Refill: Capillary refill takes less than 2 seconds.  Neurological:     Mental Status: She is alert and oriented to person, place, and time.  Psychiatric:        Mood and Affect: Mood normal.    ED Results / Procedures / Treatments   Labs (all labs ordered are listed, but only abnormal results are displayed) Labs Reviewed  COMPREHENSIVE METABOLIC PANEL - Abnormal; Notable for the following components:      Result Value   Glucose, Bld 138 (*)    All other components within normal limits  CBC - Abnormal; Notable for the following components:   WBC 16.8 (*)    All other components within normal limits  COOXEMETRY PANEL  PREGNANCY, URINE    EKG EKG Interpretation  Date/Time:  Saturday May 08 2021 19:35:16 EDT Ventricular Rate:  93 PR  Interval:  151 QRS Duration: 91 QT Interval:  364 QTC Calculation: 453 R Axis:   72 Text Interpretation: Sinus rhythm Consider left atrial enlargement Confirmed by Benjiman Core [phone]) on 05/08/2021 10:48:10 PM  Radiology CT Head Wo Contrast  Result Date: 05/08/2021 CLINICAL DATA:  Midsternal chest pain followed by dizziness EXAM: CT HEAD WITHOUT CONTRAST TECHNIQUE: Contiguous axial images were obtained from the base of the skull through the vertex without intravenous contrast. COMPARISON:  MR 07/17/2013 FINDINGS: Brain: No evidence of acute infarction, hemorrhage, hydrocephalus, extra-axial collection, visible mass lesion or mass effect. Benign dural calcifications. Midline intracranial structures are unremarkable. Cerebellar tonsils are normally positioned. Vascular: No hyperdense vessel or unexpected calcification. Skull: No calvarial fracture or suspicious osseous lesion. No scalp swelling or hematoma. Sinuses/Orbits: Paranasal sinuses and mastoid air cells are predominantly clear. Included orbital structures are unremarkable. Other: None IMPRESSION: No acute intracranial abnormality. Electronically Signed   By: Kreg Shropshire M.D.   On: 05/08/2021 23:39    Procedures Procedures   Medications Ordered in ED Medications  ondansetron (ZOFRAN) injection 4 mg (4 mg Intravenous Given 05/08/21 2023)  ketorolac (TORADOL) 15 MG/ML injection 15 mg (15 mg Intravenous Given 05/08/21 2157)  prochlorperazine (COMPAZINE) injection 5 mg (5 mg Intravenous Given 05/08/21 2159)  sodium chloride 0.9 % bolus 500 mL (0 mLs Intravenous Stopped 05/08/21 2323)    ED Course  I have reviewed the triage vital signs and the nursing notes.  Pertinent labs & imaging results that were available during my care of the patient were reviewed by me and considered in my medical decision making (see chart for details).    MDM Rules/Calculators/A&P                          Patient presented with nausea.  Had a headache.   Felt bad in the car.  Work-up reassuring.  Patient's mother got in the car and then she began to feel bad.  Carbon oxide level checked and was normal.  Does have a leukocytosis however.  Benign abdominal exam.  Potentially could be the start of something such as gastroenteritis.  Did have a history of pseudotumor cerebri.  Headache just started today so I think this is less likely an increase in her pressure but it does not become an issue again with headaches will need to be followed as an outpatient.  Will discharge home Final Clinical Impression(s) / ED Diagnoses Final diagnoses:  Nausea  Acute nonintractable headache, unspecified headache type  Leukocytosis, unspecified type    Rx / DC Orders ED Discharge Orders          Ordered    ondansetron (ZOFRAN-ODT) 4 MG disintegrating tablet  Every 8 hours PRN        05/09/21 0002             Benjiman Core, MD 05/09/21 0021

## 2021-05-08 NOTE — ED Notes (Signed)
Pt placed on NRB pending lab results
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
## 2021-05-09 MED ORDER — ONDANSETRON 4 MG PO TBDP: 4.0000 mg | ORAL_TABLET | Freq: Three times a day (TID) | ORAL | 0 refills | Status: AC | PRN

## 2021-05-09 NOTE — ED Notes (Signed)
Pt verbalizes understanding of discharge instructions. Opportunity for questioning and answers were provided. Armand removed by staff, pt discharged from ED to home. Educated to pick up Rx.  

## 2021-05-09 NOTE — Discharge Instructions (Addendum)
If your headaches continue follow-up with your doctor to make sure your pressures are okay.  Your white count is mildly up which could be a sign of something else coming such as a stomach bug.  Watch for abdominal pain or other symptoms.

## 2021-06-15 DIAGNOSIS — R635 Abnormal weight gain: Secondary | ICD-10-CM | POA: Diagnosis not present

## 2021-06-17 DIAGNOSIS — L900 Lichen sclerosus et atrophicus: Secondary | ICD-10-CM | POA: Diagnosis not present

## 2021-06-22 DIAGNOSIS — U071 COVID-19: Secondary | ICD-10-CM | POA: Diagnosis not present

## 2021-07-16 DIAGNOSIS — F411 Generalized anxiety disorder: Secondary | ICD-10-CM | POA: Diagnosis not present

## 2021-08-11 DIAGNOSIS — F411 Generalized anxiety disorder: Secondary | ICD-10-CM | POA: Diagnosis not present

## 2021-08-12 DIAGNOSIS — F411 Generalized anxiety disorder: Secondary | ICD-10-CM | POA: Diagnosis not present

## 2021-08-17 DIAGNOSIS — F411 Generalized anxiety disorder: Secondary | ICD-10-CM | POA: Diagnosis not present

## 2021-08-31 DIAGNOSIS — F411 Generalized anxiety disorder: Secondary | ICD-10-CM | POA: Diagnosis not present

## 2021-09-16 DIAGNOSIS — F411 Generalized anxiety disorder: Secondary | ICD-10-CM | POA: Diagnosis not present

## 2021-09-21 DIAGNOSIS — F411 Generalized anxiety disorder: Secondary | ICD-10-CM | POA: Diagnosis not present

## 2021-09-28 DIAGNOSIS — F411 Generalized anxiety disorder: Secondary | ICD-10-CM | POA: Diagnosis not present

## 2021-10-05 DIAGNOSIS — F411 Generalized anxiety disorder: Secondary | ICD-10-CM | POA: Diagnosis not present

## 2021-10-22 DIAGNOSIS — F411 Generalized anxiety disorder: Secondary | ICD-10-CM | POA: Diagnosis not present

## 2021-10-26 DIAGNOSIS — F411 Generalized anxiety disorder: Secondary | ICD-10-CM | POA: Diagnosis not present

## 2021-11-23 DIAGNOSIS — F411 Generalized anxiety disorder: Secondary | ICD-10-CM | POA: Diagnosis not present

## 2021-11-25 DIAGNOSIS — F410 Panic disorder [episodic paroxysmal anxiety] without agoraphobia: Secondary | ICD-10-CM | POA: Diagnosis not present

## 2021-11-25 DIAGNOSIS — F330 Major depressive disorder, recurrent, mild: Secondary | ICD-10-CM | POA: Diagnosis not present

## 2021-11-25 DIAGNOSIS — F419 Anxiety disorder, unspecified: Secondary | ICD-10-CM | POA: Diagnosis not present

## 2021-11-30 DIAGNOSIS — F411 Generalized anxiety disorder: Secondary | ICD-10-CM | POA: Diagnosis not present

## 2021-12-07 DIAGNOSIS — F411 Generalized anxiety disorder: Secondary | ICD-10-CM | POA: Diagnosis not present

## 2021-12-14 DIAGNOSIS — F411 Generalized anxiety disorder: Secondary | ICD-10-CM | POA: Diagnosis not present

## 2022-01-04 DIAGNOSIS — F411 Generalized anxiety disorder: Secondary | ICD-10-CM | POA: Diagnosis not present

## 2022-01-11 DIAGNOSIS — F411 Generalized anxiety disorder: Secondary | ICD-10-CM | POA: Diagnosis not present

## 2022-01-21 DIAGNOSIS — F411 Generalized anxiety disorder: Secondary | ICD-10-CM | POA: Diagnosis not present

## 2022-01-25 DIAGNOSIS — F411 Generalized anxiety disorder: Secondary | ICD-10-CM | POA: Diagnosis not present

## 2022-02-02 DIAGNOSIS — E8881 Metabolic syndrome: Secondary | ICD-10-CM | POA: Diagnosis not present

## 2022-02-02 DIAGNOSIS — E782 Mixed hyperlipidemia: Secondary | ICD-10-CM | POA: Diagnosis not present

## 2022-02-02 DIAGNOSIS — Z6839 Body mass index (BMI) 39.0-39.9, adult: Secondary | ICD-10-CM | POA: Diagnosis not present

## 2022-02-02 DIAGNOSIS — E559 Vitamin D deficiency, unspecified: Secondary | ICD-10-CM | POA: Diagnosis not present

## 2022-02-02 DIAGNOSIS — N951 Menopausal and female climacteric states: Secondary | ICD-10-CM | POA: Diagnosis not present

## 2022-02-02 DIAGNOSIS — R635 Abnormal weight gain: Secondary | ICD-10-CM | POA: Diagnosis not present

## 2022-02-08 DIAGNOSIS — F411 Generalized anxiety disorder: Secondary | ICD-10-CM | POA: Diagnosis not present

## 2022-02-09 DIAGNOSIS — F419 Anxiety disorder, unspecified: Secondary | ICD-10-CM | POA: Diagnosis not present

## 2022-02-09 DIAGNOSIS — N951 Menopausal and female climacteric states: Secondary | ICD-10-CM | POA: Diagnosis not present

## 2022-02-09 DIAGNOSIS — Z1331 Encounter for screening for depression: Secondary | ICD-10-CM | POA: Diagnosis not present

## 2022-02-09 DIAGNOSIS — Z1339 Encounter for screening examination for other mental health and behavioral disorders: Secondary | ICD-10-CM | POA: Diagnosis not present

## 2022-02-09 DIAGNOSIS — F32A Depression, unspecified: Secondary | ICD-10-CM | POA: Diagnosis not present

## 2022-02-09 DIAGNOSIS — R635 Abnormal weight gain: Secondary | ICD-10-CM | POA: Diagnosis not present

## 2022-02-25 DIAGNOSIS — F410 Panic disorder [episodic paroxysmal anxiety] without agoraphobia: Secondary | ICD-10-CM | POA: Diagnosis not present

## 2022-02-25 DIAGNOSIS — F419 Anxiety disorder, unspecified: Secondary | ICD-10-CM | POA: Diagnosis not present

## 2022-02-25 DIAGNOSIS — F330 Major depressive disorder, recurrent, mild: Secondary | ICD-10-CM | POA: Diagnosis not present

## 2022-03-01 DIAGNOSIS — F411 Generalized anxiety disorder: Secondary | ICD-10-CM | POA: Diagnosis not present

## 2022-03-02 DIAGNOSIS — E8881 Metabolic syndrome: Secondary | ICD-10-CM | POA: Diagnosis not present

## 2022-03-02 DIAGNOSIS — Z6838 Body mass index (BMI) 38.0-38.9, adult: Secondary | ICD-10-CM | POA: Diagnosis not present

## 2022-03-16 DIAGNOSIS — Z6839 Body mass index (BMI) 39.0-39.9, adult: Secondary | ICD-10-CM | POA: Diagnosis not present

## 2022-03-16 DIAGNOSIS — F411 Generalized anxiety disorder: Secondary | ICD-10-CM | POA: Diagnosis not present

## 2022-03-16 DIAGNOSIS — E559 Vitamin D deficiency, unspecified: Secondary | ICD-10-CM | POA: Diagnosis not present

## 2022-03-30 DIAGNOSIS — F411 Generalized anxiety disorder: Secondary | ICD-10-CM | POA: Diagnosis not present

## 2022-04-22 DIAGNOSIS — F411 Generalized anxiety disorder: Secondary | ICD-10-CM | POA: Diagnosis not present

## 2022-04-29 DIAGNOSIS — F411 Generalized anxiety disorder: Secondary | ICD-10-CM | POA: Diagnosis not present

## 2022-05-13 DIAGNOSIS — F411 Generalized anxiety disorder: Secondary | ICD-10-CM | POA: Diagnosis not present

## 2022-05-20 DIAGNOSIS — F411 Generalized anxiety disorder: Secondary | ICD-10-CM | POA: Diagnosis not present

## 2022-05-20 DIAGNOSIS — F410 Panic disorder [episodic paroxysmal anxiety] without agoraphobia: Secondary | ICD-10-CM | POA: Diagnosis not present

## 2022-05-20 DIAGNOSIS — F330 Major depressive disorder, recurrent, mild: Secondary | ICD-10-CM | POA: Diagnosis not present

## 2022-05-20 DIAGNOSIS — F419 Anxiety disorder, unspecified: Secondary | ICD-10-CM | POA: Diagnosis not present

## 2022-05-24 DIAGNOSIS — F411 Generalized anxiety disorder: Secondary | ICD-10-CM | POA: Diagnosis not present

## 2022-05-26 DIAGNOSIS — E782 Mixed hyperlipidemia: Secondary | ICD-10-CM | POA: Diagnosis not present

## 2022-05-26 DIAGNOSIS — Z6841 Body Mass Index (BMI) 40.0 and over, adult: Secondary | ICD-10-CM | POA: Diagnosis not present

## 2022-05-26 DIAGNOSIS — E8881 Metabolic syndrome: Secondary | ICD-10-CM | POA: Diagnosis not present

## 2022-05-31 DIAGNOSIS — F411 Generalized anxiety disorder: Secondary | ICD-10-CM | POA: Diagnosis not present

## 2022-06-02 ENCOUNTER — Other Ambulatory Visit: Payer: Self-pay | Admitting: Nurse Practitioner

## 2022-06-02 DIAGNOSIS — E8881 Metabolic syndrome: Secondary | ICD-10-CM | POA: Diagnosis not present

## 2022-06-02 DIAGNOSIS — N926 Irregular menstruation, unspecified: Secondary | ICD-10-CM | POA: Diagnosis not present

## 2022-06-02 DIAGNOSIS — Z6839 Body mass index (BMI) 39.0-39.9, adult: Secondary | ICD-10-CM | POA: Diagnosis not present

## 2022-06-09 DIAGNOSIS — E282 Polycystic ovarian syndrome: Secondary | ICD-10-CM | POA: Diagnosis not present

## 2022-06-09 DIAGNOSIS — N926 Irregular menstruation, unspecified: Secondary | ICD-10-CM | POA: Diagnosis not present

## 2022-06-09 DIAGNOSIS — Z6838 Body mass index (BMI) 38.0-38.9, adult: Secondary | ICD-10-CM | POA: Diagnosis not present

## 2022-06-20 DIAGNOSIS — Z6838 Body mass index (BMI) 38.0-38.9, adult: Secondary | ICD-10-CM | POA: Diagnosis not present

## 2022-06-20 DIAGNOSIS — E782 Mixed hyperlipidemia: Secondary | ICD-10-CM | POA: Diagnosis not present

## 2022-06-21 ENCOUNTER — Ambulatory Visit
Admission: RE | Admit: 2022-06-21 | Discharge: 2022-06-21 | Disposition: A | Discharge status: Home / Self Care | Payer: BC Managed Care – PPO | Source: Ambulatory Visit | Attending: Nurse Practitioner | Admitting: Nurse Practitioner

## 2022-06-21 DIAGNOSIS — N888 Other specified noninflammatory disorders of cervix uteri: Secondary | ICD-10-CM | POA: Diagnosis not present

## 2022-06-21 DIAGNOSIS — N926 Irregular menstruation, unspecified: Secondary | ICD-10-CM

## 2022-06-21 DIAGNOSIS — F411 Generalized anxiety disorder: Secondary | ICD-10-CM | POA: Diagnosis not present

## 2022-07-04 DIAGNOSIS — Z6838 Body mass index (BMI) 38.0-38.9, adult: Secondary | ICD-10-CM | POA: Diagnosis not present

## 2022-07-04 DIAGNOSIS — E559 Vitamin D deficiency, unspecified: Secondary | ICD-10-CM | POA: Diagnosis not present

## 2022-07-07 DIAGNOSIS — N926 Irregular menstruation, unspecified: Secondary | ICD-10-CM | POA: Diagnosis not present

## 2022-07-07 DIAGNOSIS — E282 Polycystic ovarian syndrome: Secondary | ICD-10-CM | POA: Diagnosis not present

## 2022-07-15 DIAGNOSIS — F411 Generalized anxiety disorder: Secondary | ICD-10-CM | POA: Diagnosis not present

## 2022-07-26 DIAGNOSIS — F411 Generalized anxiety disorder: Secondary | ICD-10-CM | POA: Diagnosis not present

## 2022-09-09 DIAGNOSIS — F411 Generalized anxiety disorder: Secondary | ICD-10-CM | POA: Diagnosis not present

## 2022-09-19 DIAGNOSIS — E782 Mixed hyperlipidemia: Secondary | ICD-10-CM | POA: Diagnosis not present

## 2022-10-04 DIAGNOSIS — F411 Generalized anxiety disorder: Secondary | ICD-10-CM | POA: Diagnosis not present

## 2022-10-12 DIAGNOSIS — N926 Irregular menstruation, unspecified: Secondary | ICD-10-CM | POA: Diagnosis not present

## 2022-10-18 DIAGNOSIS — F411 Generalized anxiety disorder: Secondary | ICD-10-CM | POA: Diagnosis not present

## 2022-11-07 IMAGING — CT CT HEAD W/O CM
4 series · 17 of 47 positions shown, 19 images · non-contrast
Comparison: MR 07/17/2013

CLINICAL DATA: Midsternal chest pain followed by dizziness

EXAM:
CT HEAD WITHOUT CONTRAST
TECHNIQUE: Contiguous axial images were obtained from the base of the skull
through the vertex without intravenous contrast.

[Series 2: head wo · axial · 0.41mm/px · z∈[-242,-132]mm · 7 of 30 slices shown, 9 images]
[im 4/30  brain]
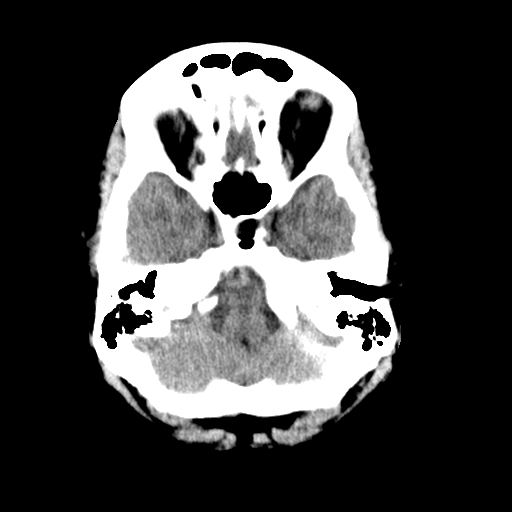
[im 4/30  bone]
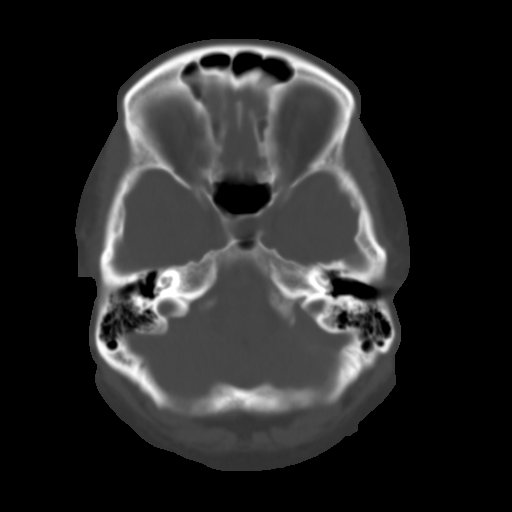
[im 8/30  brain]
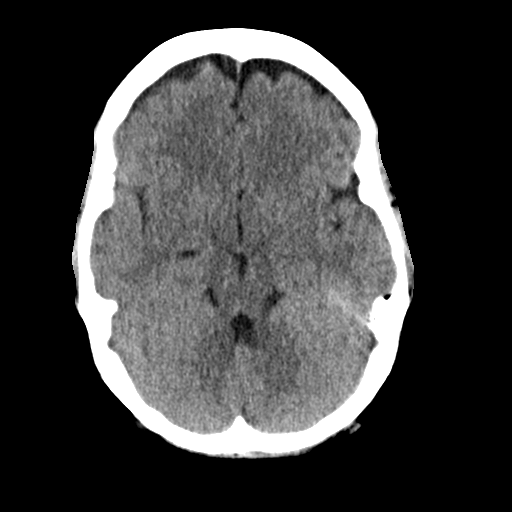
[im 11/30  brain]
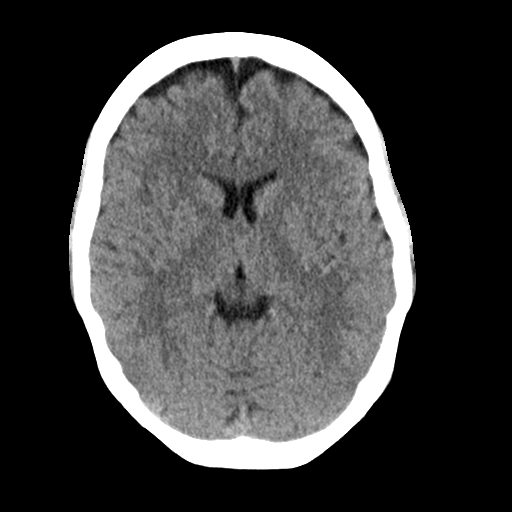
[im 15/30  brain]
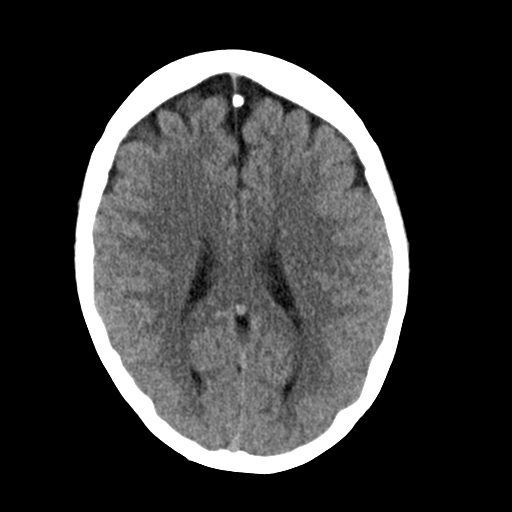
[im 19/30  brain]
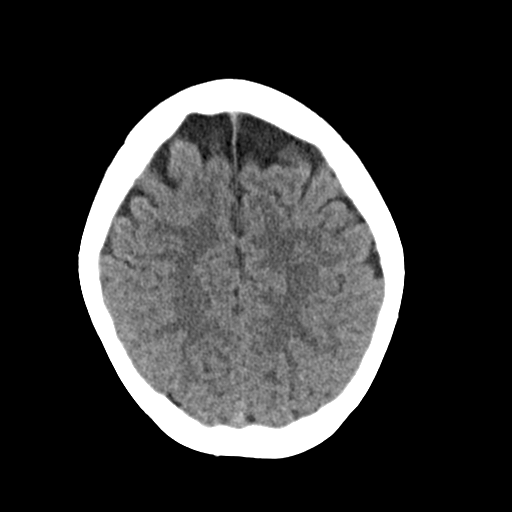
[im 19/30  bone]
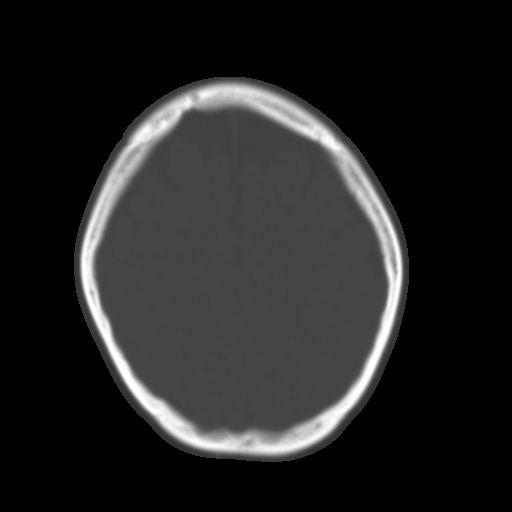
[im 22/30  brain]
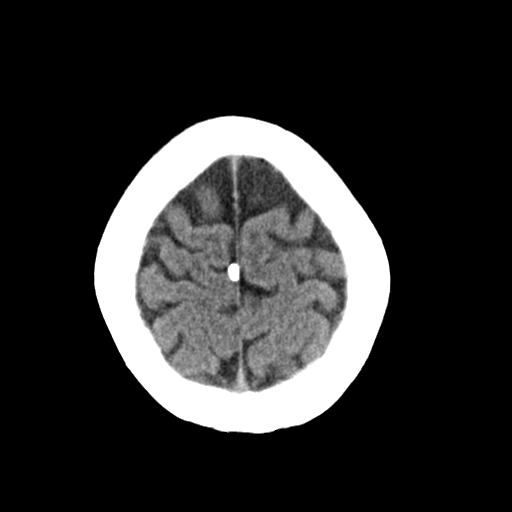
[im 26/30  brain]
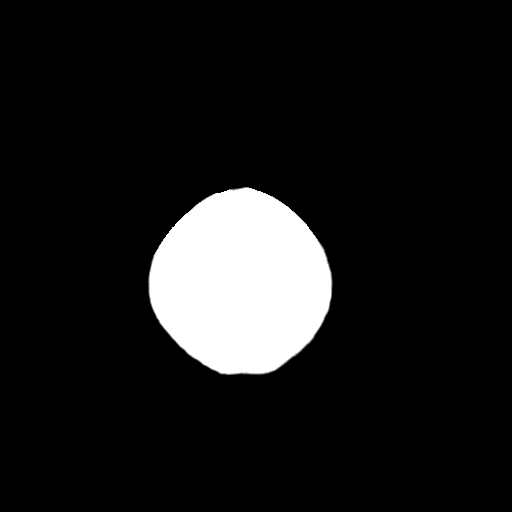

[Series 3: head bone · axial · 0.41mm/px · z∈[-243,-193]mm · 4 of 74 slices shown]
[im 8/74  bone]
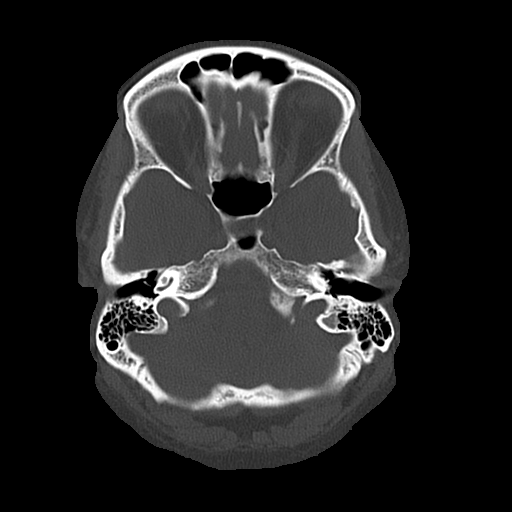
[im 15/74  bone]
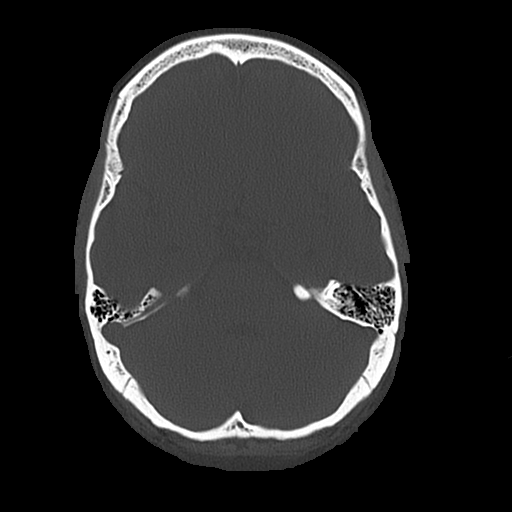
[im 22/74  bone]
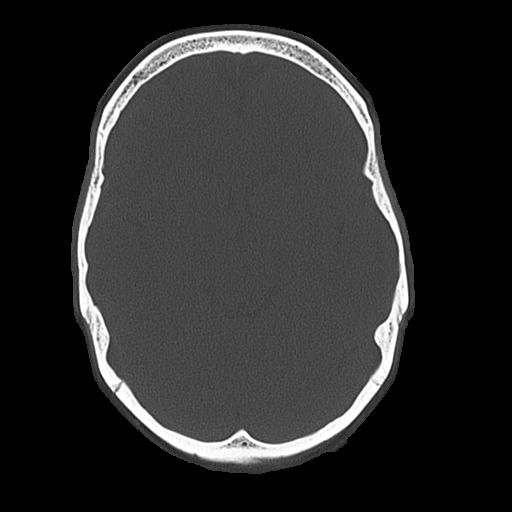
[im 33/74  bone]
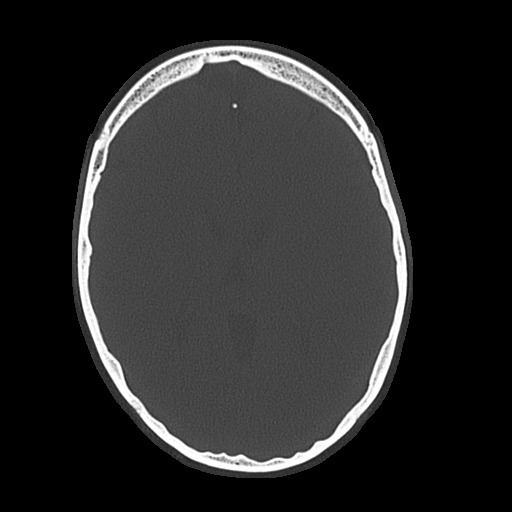

[Series 4: coronal soft · coronal · 0.30mm/px · 3 of 63 slices shown]
[im 21/63  brain]
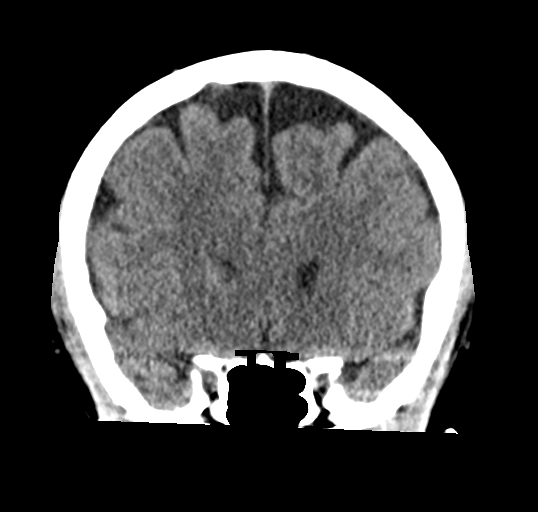
[im 28/63  brain]
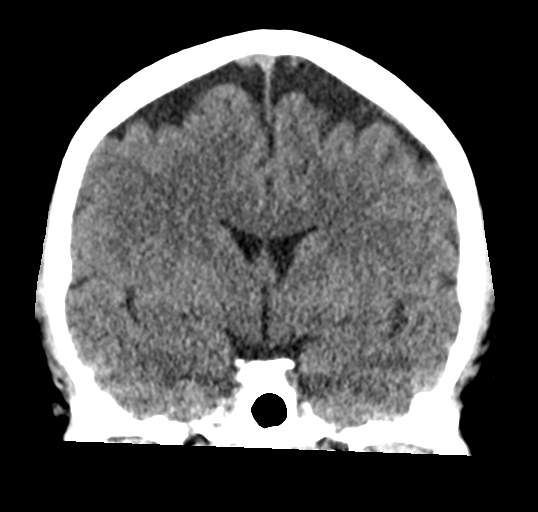
[im 35/63  brain]
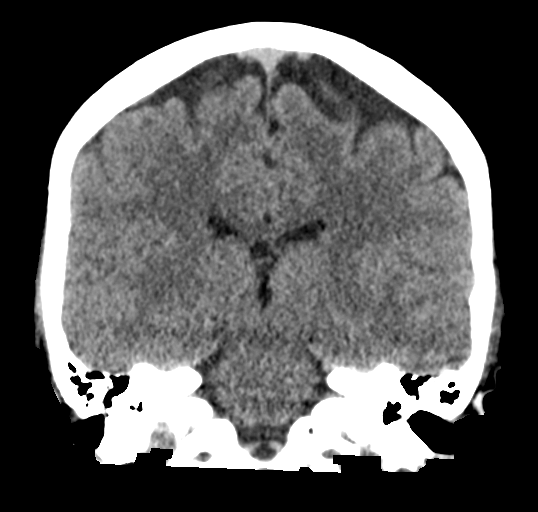

[Series 5: sagittal soft · sagittal · 0.33mm/px · 3 of 52 slices shown]
[im 18/52  brain]
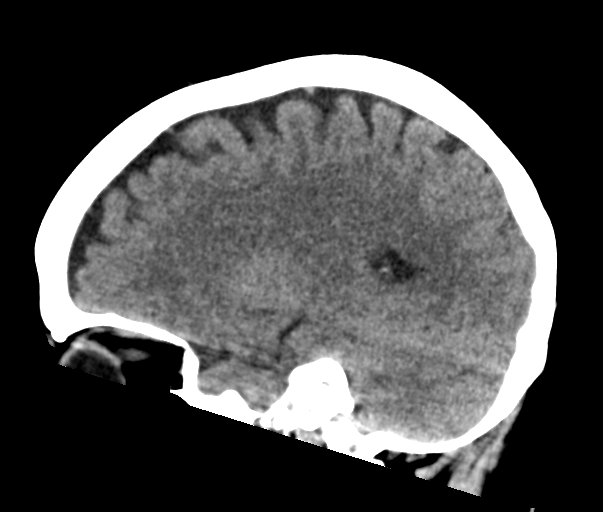
[im 26/52  brain]
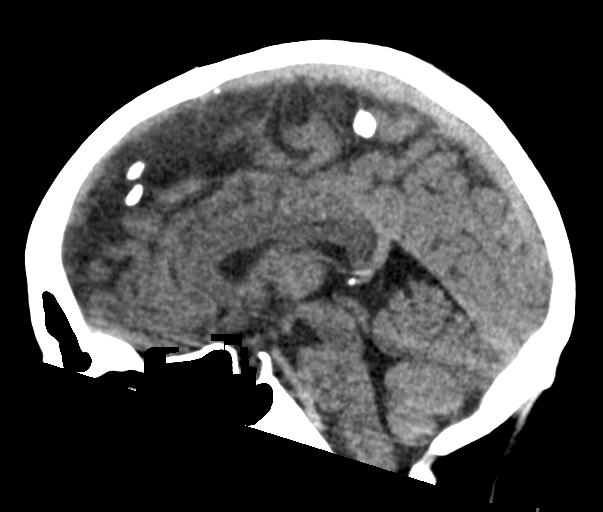
[im 35/52  brain]
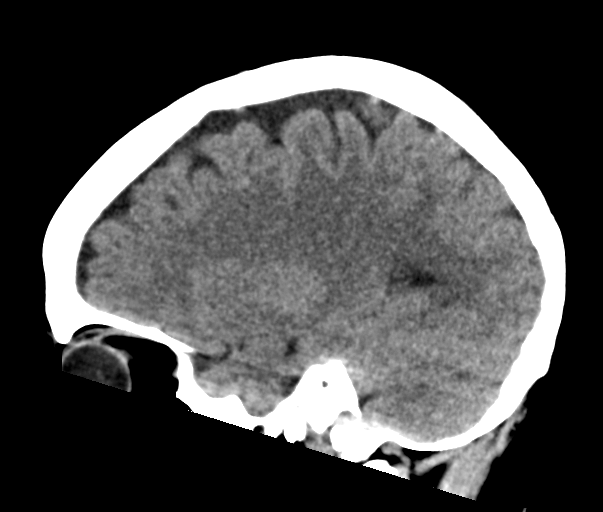

[17 of 47 positions shown; findings below may reference images not displayed]

FINDINGS: Brain: No evidence of acute infarction, hemorrhage, hydrocephalus,
extra-axial collection, visible mass lesion or mass effect. Benign
dural calcifications. Midline intracranial structures are
unremarkable. Cerebellar tonsils are normally positioned.

Vascular: No hyperdense vessel or unexpected calcification.

Skull: No calvarial fracture or suspicious osseous lesion. No scalp
swelling or hematoma.

Sinuses/Orbits: Paranasal sinuses and mastoid air cells are
predominantly clear. Included orbital structures are unremarkable.

Other: None
IMPRESSION: No acute intracranial abnormality.

## 2022-12-14 DIAGNOSIS — F411 Generalized anxiety disorder: Secondary | ICD-10-CM | POA: Diagnosis not present

## 2023-01-11 DIAGNOSIS — H6692 Otitis media, unspecified, left ear: Secondary | ICD-10-CM | POA: Diagnosis not present

## 2023-02-16 DIAGNOSIS — Z124 Encounter for screening for malignant neoplasm of cervix: Secondary | ICD-10-CM | POA: Diagnosis not present

## 2023-02-16 DIAGNOSIS — Z01419 Encounter for gynecological examination (general) (routine) without abnormal findings: Secondary | ICD-10-CM | POA: Diagnosis not present

## 2023-02-21 DIAGNOSIS — F411 Generalized anxiety disorder: Secondary | ICD-10-CM | POA: Diagnosis not present

## 2023-05-22 DIAGNOSIS — G4719 Other hypersomnia: Secondary | ICD-10-CM | POA: Diagnosis not present

## 2023-06-12 DIAGNOSIS — G4733 Obstructive sleep apnea (adult) (pediatric): Secondary | ICD-10-CM | POA: Diagnosis not present

## 2023-06-28 DIAGNOSIS — U071 COVID-19: Secondary | ICD-10-CM | POA: Diagnosis not present

## 2023-07-10 DIAGNOSIS — G4733 Obstructive sleep apnea (adult) (pediatric): Secondary | ICD-10-CM | POA: Diagnosis not present

## 2023-08-10 DIAGNOSIS — G4733 Obstructive sleep apnea (adult) (pediatric): Secondary | ICD-10-CM | POA: Diagnosis not present

## 2023-09-09 DIAGNOSIS — G4733 Obstructive sleep apnea (adult) (pediatric): Secondary | ICD-10-CM | POA: Diagnosis not present

## 2023-09-28 DIAGNOSIS — G4733 Obstructive sleep apnea (adult) (pediatric): Secondary | ICD-10-CM | POA: Diagnosis not present

## 2023-10-10 DIAGNOSIS — G4733 Obstructive sleep apnea (adult) (pediatric): Secondary | ICD-10-CM | POA: Diagnosis not present

## 2024-03-06 DIAGNOSIS — G4733 Obstructive sleep apnea (adult) (pediatric): Secondary | ICD-10-CM | POA: Diagnosis not present

## 2024-03-12 DIAGNOSIS — H10501 Unspecified blepharoconjunctivitis, right eye: Secondary | ICD-10-CM | POA: Diagnosis not present

## 2024-04-15 DIAGNOSIS — R509 Fever, unspecified: Secondary | ICD-10-CM | POA: Diagnosis not present

## 2024-04-15 DIAGNOSIS — A084 Viral intestinal infection, unspecified: Secondary | ICD-10-CM | POA: Diagnosis not present

## 2024-04-15 DIAGNOSIS — Z03818 Encounter for observation for suspected exposure to other biological agents ruled out: Secondary | ICD-10-CM | POA: Diagnosis not present
